# Patient Record
Sex: Male | Born: 1955 | Race: White | Hispanic: No | Marital: Married | Smoking: Never smoker
Health system: Southern US, Community
[De-identification: ages and names within clinical notes are randomized; demographics above are authoritative.]

## PROBLEM LIST (undated history)

## (undated) DIAGNOSIS — E119 Type 2 diabetes mellitus without complications: Secondary | ICD-10-CM

## (undated) DIAGNOSIS — I10 Essential (primary) hypertension: Secondary | ICD-10-CM

## (undated) DIAGNOSIS — M109 Gout, unspecified: Secondary | ICD-10-CM

## (undated) HISTORY — PX: COLONOSCOPY: SHX174

## (undated) HISTORY — PX: NOSE SURGERY: SHX723

## (undated) HISTORY — PX: HERNIA REPAIR: SHX51

## (undated) HISTORY — PX: TONSILLECTOMY: SUR1361

---

## 2010-09-05 ENCOUNTER — Inpatient Hospital Stay (HOSPITAL_COMMUNITY)
Admission: EM | Admit: 2010-09-05 | Discharge: 2010-09-07 | DRG: 570 | Disposition: A | Discharge status: Home / Self Care | Payer: BC Managed Care – PPO | Attending: Internal Medicine | Admitting: Internal Medicine

## 2010-09-05 DIAGNOSIS — Z880 Allergy status to penicillin: Secondary | ICD-10-CM

## 2010-09-05 DIAGNOSIS — N410 Acute prostatitis: Principal | ICD-10-CM | POA: Diagnosis present

## 2010-09-05 DIAGNOSIS — K5730 Diverticulosis of large intestine without perforation or abscess without bleeding: Secondary | ICD-10-CM | POA: Diagnosis present

## 2010-09-05 DIAGNOSIS — I10 Essential (primary) hypertension: Secondary | ICD-10-CM | POA: Diagnosis present

## 2010-09-05 DIAGNOSIS — N179 Acute kidney failure, unspecified: Secondary | ICD-10-CM | POA: Diagnosis present

## 2010-09-05 DIAGNOSIS — R7402 Elevation of levels of lactic acid dehydrogenase (LDH): Secondary | ICD-10-CM | POA: Diagnosis present

## 2010-09-05 DIAGNOSIS — N390 Urinary tract infection, site not specified: Secondary | ICD-10-CM | POA: Diagnosis present

## 2010-09-05 DIAGNOSIS — R7401 Elevation of levels of liver transaminase levels: Secondary | ICD-10-CM | POA: Diagnosis present

## 2010-09-05 DIAGNOSIS — N139 Obstructive and reflux uropathy, unspecified: Secondary | ICD-10-CM | POA: Diagnosis present

## 2010-09-05 DIAGNOSIS — Y929 Unspecified place or not applicable: Secondary | ICD-10-CM

## 2010-09-05 DIAGNOSIS — I959 Hypotension, unspecified: Secondary | ICD-10-CM | POA: Diagnosis present

## 2010-09-05 DIAGNOSIS — A498 Other bacterial infections of unspecified site: Secondary | ICD-10-CM | POA: Diagnosis present

## 2010-09-05 LAB — COMPREHENSIVE METABOLIC PANEL
Albumin: 3.4 g/dL — ABNORMAL LOW (ref 3.5–5.2)
Alkaline Phosphatase: 141 U/L — ABNORMAL HIGH (ref 39–117)
BUN: 19 mg/dL (ref 6–23)
Creatinine, Ser: 1.36 mg/dL — ABNORMAL HIGH (ref 0.50–1.35)
GFR calc Af Amer: >60 mL/min (ref >60)
Glucose, Bld: 128 mg/dL — ABNORMAL HIGH (ref 70–99)
Total Protein: 7.1 g/dL (ref 6.0–8.3)

## 2010-09-05 LAB — CBC
HCT: 39.1 % (ref 39.0–52.0)
MCH: 30.9 pg (ref 26.0–34.0)
MCHC: 34.3 g/dL (ref 30.0–36.0)
MCV: 90.3 fL (ref 78.0–100.0)
Platelets: 148 10*3/uL — ABNORMAL LOW (ref 150–400)
RDW: 13.8 % (ref 11.5–15.5)

## 2010-09-05 LAB — URINALYSIS, ROUTINE W REFLEX MICROSCOPIC
Glucose, UA: NEGATIVE mg/dL
Ketones, ur: NEGATIVE mg/dL
Nitrite: NEGATIVE
Protein, ur: NEGATIVE mg/dL
pH: 6 (ref 5.0–8.0)

## 2010-09-05 LAB — DIFFERENTIAL
Eosinophils Absolute: 0 10*3/uL (ref 0.0–0.7)
Eosinophils Relative: 0 % (ref 0–5)
Neutro Abs: 4.7 10*3/uL (ref 1.7–7.7)
Neutrophils Relative %: 79 % — ABNORMAL HIGH (ref 43–77)

## 2010-09-05 LAB — URINE MICROSCOPIC-ADD ON

## 2010-09-06 ENCOUNTER — Inpatient Hospital Stay (HOSPITAL_COMMUNITY): Payer: BC Managed Care – PPO

## 2010-09-06 LAB — DIFFERENTIAL
Eosinophils Absolute: 0.1 10*3/uL (ref 0.0–0.7)
Eosinophils Relative: 1 % (ref 0–5)
Lymphocytes Relative: 16 % (ref 12–46)
Lymphs Abs: 1 10*3/uL (ref 0.7–4.0)
Monocytes Absolute: 0.8 10*3/uL (ref 0.1–1.0)

## 2010-09-06 LAB — CBC
HCT: 37.8 % — ABNORMAL LOW (ref 39.0–52.0)
MCH: 30.6 pg (ref 26.0–34.0)
MCHC: 33.6 g/dL (ref 30.0–36.0)
MCV: 91.1 fL (ref 78.0–100.0)
Platelets: 140 10*3/uL — ABNORMAL LOW (ref 150–400)
RDW: 14.2 % (ref 11.5–15.5)

## 2010-09-06 LAB — HEPATITIS PANEL, ACUTE
HCV Ab: NEGATIVE
Hep A IgM: NEGATIVE
Hep B C IgM: NEGATIVE
Hepatitis B Surface Ag: NEGATIVE

## 2010-09-06 LAB — COMPREHENSIVE METABOLIC PANEL
AST: 40 U/L — ABNORMAL HIGH (ref 0–37)
Albumin: 2.8 g/dL — ABNORMAL LOW (ref 3.5–5.2)
BUN: 17 mg/dL (ref 6–23)
Calcium: 9.1 mg/dL (ref 8.4–10.5)
Creatinine, Ser: 1.18 mg/dL (ref 0.50–1.35)
Total Bilirubin: 0.2 mg/dL — ABNORMAL LOW (ref 0.3–1.2)
Total Protein: 6.9 g/dL (ref 6.0–8.3)

## 2010-09-06 LAB — MAGNESIUM: Magnesium: 2.4 mg/dL (ref 1.5–2.5)

## 2010-09-06 MED ORDER — IOHEXOL 300 MG/ML  SOLN: 100.0000 mL | Freq: Once | INTRAMUSCULAR | Status: AC | PRN

## 2010-09-07 LAB — CBC
HCT: 37.2 % — ABNORMAL LOW (ref 39.0–52.0)
MCHC: 33.1 g/dL (ref 30.0–36.0)
Platelets: 165 10*3/uL (ref 150–400)
RDW: 14.1 % (ref 11.5–15.5)

## 2010-09-07 LAB — COMPREHENSIVE METABOLIC PANEL
AST: 33 U/L (ref 0–37)
Albumin: 3.1 g/dL — ABNORMAL LOW (ref 3.5–5.2)
Alkaline Phosphatase: 111 U/L (ref 39–117)
BUN: 14 mg/dL (ref 6–23)
Potassium: 4.2 mEq/L (ref 3.5–5.1)
Total Protein: 6.6 g/dL (ref 6.0–8.3)

## 2010-09-07 LAB — URINE CULTURE
Colony Count: NO GROWTH
Culture  Setup Time: 201206220112
Culture: NO GROWTH

## 2010-09-09 NOTE — H&P (Signed)
NAME:  Jeffrey [name], SUIT NO.:  [id number]  [name]  LOCATION:                                 FACILITY:  PHYSICIAN:  Talmage Nap, MD  DATE OF BIRTH:  [birth year]  DATE OF ADMISSION:  09/06/2010 DATE OF DISCHARGE:                             HISTORY & PHYSICAL   PRIMARY CARE PHYSICIAN:  Unassigned.  History obtainable from the patient and the patient's spouse.  CHIEF COMPLAINT:  Persistent fever with slight urinary hesitancy of about 2 days' duration.  HISTORY OF PRESENT ILLNESS:  The patient is a 55 year old Caucasian male with no known medical history presenting to the emergency room with recurrent fever.  The patient claimed that 2 days prior to presenting to the emergency room, he developed a fever and this was said to be associated with slight urinary hesitancy.  He denied any history of nausea.  He denied any history of vomiting.  He denied any chills or rigor.  He saw his urologist, who gave the patient IM gentamicin and some analgesics to use; however, fever was said to have persisted and the patient was still having urinary hesitancy.  He denied any chest pain. He denied any shortness of breath, but this around he claimed he had chills and rigor, and subsequently presented to the emergency room to be evaluated.  He has no known past medical history.  PAST SURGICAL HISTORY:  Left herniorrhaphy since the patient was a kid.  PREADMISSION MEDICATIONS:  Include Bactrim and Flomax.  ALLERGIES:  PENICILLIN.  SOCIAL HISTORY:  Negative for alcohol or tobacco use.  The patient works with live [employer].  FAMILY HISTORY:  York Spaniel to be positive for diabetes mellitus.  REVIEW OF SYSTEMS:  The patient denies any history of headaches.  No blurred vision.  No nausea or vomiting.  Fever has subsided.  He denied any chest pain.  Denies any cough.  Denies any PND or orthopnea.  No abdominal discomfort.  He complained of slight urinary  hesitancy. Denies any dysuria or hematuria.  No diarrhea or hematochezia.  No swelling of the lower extremities.  No intolerance to heat or cold and no neuropsychiatric disorder.  PHYSICAL EXAMINATION:  GENERAL:  Middle-aged man with suboptimal hydration, not in any obvious respiratory distress. PRESENT VITAL SIGNS:  Blood pressure is 112/77, temperature is 98.4, pulse is 79, respiratory rate 18. HEENT:  Pupils are reactive to light and extraocular muscles are intact. NECK:  No jugular venous distention.  No carotid bruit.  No lymphadenopathy. CHEST:  Clear to auscultation. HEART:  S1 and S2. ABDOMEN:  Soft, nontender.  Liver, spleen tip not palpable.  Bowel sounds are positive. EXTREMITIES:  No pedal edema. NEUROLOGIC:  Nonfocal. MUSCULOSKELETAL SYSTEM:  Unremarkable. SKIN:  Shows slightly decreased turgor.  LABORATORY DATA:  Initial complete blood count with differential showed WBC of 6.0, hemoglobin of 13.4, hematocrit 39.1, MCV of 90.2 with a platelet count of 148, neutrophils 79%, and absolute neutrophil count is 4.7.  Lactic acid level is 1.3.  Comprehensive metabolic panel showed sodium of 140, potassium of 3.8, chloride of 102 with a bicarb of 24, glucose is 128, BUN is 19, creatinine is 1.36.  LFTs showed total bilirubin 0.2, alkaline phosphatase 141, AST 55, ALT 57.  Urinalysis negative nitrite with moderate leukocyte esterase.  Urine microscopy; wbc 25-50, rare bacteria.  IMPRESSION: 1. Recurrent fever, questionable secondary to acute prostatitis. 2. Questionable bladder outlet obstruction. 3. Abnormal LFT to rule out hepatitis.  PLAN:  Admit the patient to general medical floor.  The patient will be adequately rehydrated with half-normal saline IV to go at a rate of 100 cc an hour.  He will be on Cipro 400 mg IV q.12, Flomax 0.4 mg p.o. b.i.d.  Fever will be controlled with ibuprofen 400 mg p.o. t.i.d. p.r.n.  He will be on Protonix 40 mg p.o. daily for GI  prophylaxis and TED stocking or SCDs boots for DVT prophylaxis.  Further workup to be done on this patient will include urine culture, blood culture x2, hepatitis panel to include A, B, and C because of the abnormal LFT. CBC, CMP, and magnesium will be repeated in a.m.  The patient will be followed and evaluated on a day-to-day basis.     Talmage Nap, MD     CN/MEDQ  D:  09/06/2010  T:  09/06/2010  Job:  [id number]  Electronically Signed by Talmage Nap  on 09/09/2010 06:38:12 PM

## 2010-09-10 NOTE — Discharge Summary (Signed)
  NAME:  Jeffrey [name], Jeffrey [name] NO.:  [id number]  [name]  LOCATION:  1538                         FACILITY:  [facility]  PHYSICIAN:  Conley Canal, MD      DATE OF BIRTH:  1956-02-12  DATE OF ADMISSION:  09/05/2010 DATE OF DISCHARGE:  09/07/2010                        DISCHARGE SUMMARY - REFERRING   UROLOGIST:  Mark C. Vernie Ammons, M.D.  DISCHARGE DIAGNOSES: 1. Prostatitis, possibility of prostate cancer. 2. Status post left herniorrhaphy in childhood. 3. Transient transaminitis. 4. Acute kidney injury, probably combination of medications and     hypotension, since resolved. 5. Diverticulosis.  PROCEDURES PERFORMED:  CT abdomen and pelvis on June 22 showed a 3-cm asymmetric soft tissue density involving the prostate in the right posterior peripheral zone, which may be G2 prostate carcinoma or prostatitis.  It also showed some diverticulosis with no radiographic evidence of diverticulitis.  HOSPITAL COURSE:  This extremely pleasant 55 year old male came in with complaints of fever and urinary hesitancy.  The patient had been getting antibiotics on and off in the office for E. coli urinary tract infection with suspicion for prostatitis.  However, he kept having fever despite antibiotics, hence ending up in the emergency room.  His urinalysis confirmed UTI with nitrites negative, leukocytes positive, WBC 21 to 50. A urine culture was pending at the time of discharge, but we were able to obtain records from Dr. Margrett Rud office which showed that E. coli was pansensitive.  Upon admission, the patient was placed on ceftriaxone and Flagyl, then switched to ciprofloxacin.  His white count was 6.  It has remained at 6 on the day of discharge.  Renal function showed a BUN of 19, creatinine 1.36 and he had transaminitis with alkaline phosphatase of 141, AST 55, ALT 57.  His blood pressure was low normal. According to the patient and his wife, he was taking a  lot of Motrin and Tylenol around the clock for the pain and fever.  This may have contributed to the transaminitis and there may have been an element of hypotension.  Hepatitis panel was negative and the transaminitis had resolved on the day of discharge.  Renal function was back to normal also with a BUN of 14, creatinine 1.22.  I discussed with Dr. Brunilda Payor regarding the abnormal CT abdomen and pelvis and his thought was that if patient does not have fever he can be followed in the office by Dr. Vernie Ammons to consider further management and that the patient should be on ciprofloxacin.  Looking at the sensitivities, the patient seems to be more sensitive to Levaquin which he will be discharged on for at least 10 days.  He is hemodynamically stable and is discharged in stable condition.  Time spent for discharge preparation is less than 30 minutes.     Conley Canal, MD     SR/MEDQ  D:  09/07/2010  T:  09/07/2010  Job:  [id number]  cc:   Veverly Fells. Vernie Ammons, M.D. Fax: [phone]  Lindaann Slough, M.D. Fax: [phone]  Electronically Signed by Conley Canal  on 09/10/2010 10:21:34 AM

## 2010-09-12 LAB — CULTURE, BLOOD (ROUTINE X 2)
Culture  Setup Time: 201206220826
Culture: NO GROWTH

## 2013-11-30 ENCOUNTER — Ambulatory Visit
Admission: RE | Admit: 2013-11-30 | Discharge: 2013-11-30 | Disposition: A | Discharge status: Home / Self Care | Payer: BC Managed Care – PPO | Source: Ambulatory Visit | Attending: Family Medicine | Admitting: Family Medicine

## 2013-11-30 ENCOUNTER — Other Ambulatory Visit: Payer: Self-pay | Admitting: Family Medicine

## 2013-11-30 DIAGNOSIS — M79672 Pain in left foot: Secondary | ICD-10-CM

## 2017-04-03 ENCOUNTER — Other Ambulatory Visit: Payer: Self-pay | Admitting: Urology

## 2017-04-03 DIAGNOSIS — R972 Elevated prostate specific antigen [PSA]: Secondary | ICD-10-CM

## 2017-04-20 ENCOUNTER — Ambulatory Visit
Admission: RE | Admit: 2017-04-20 | Discharge: 2017-04-20 | Disposition: A | Discharge status: Home / Self Care | Payer: BLUE CROSS/BLUE SHIELD | Source: Ambulatory Visit | Attending: Urology | Admitting: Urology

## 2017-04-20 DIAGNOSIS — R972 Elevated prostate specific antigen [PSA]: Secondary | ICD-10-CM

## 2017-04-20 MED ORDER — GADOBENATE DIMEGLUMINE 529 MG/ML IV SOLN
19.0000 mL | Freq: Once | INTRAVENOUS | Status: AC | PRN
  Administered 2017-04-20: 19 mL via INTRAVENOUS

## 2019-09-13 IMAGING — MR MR PROSTATE WO/W CM
56 series · 56 of 56 positions shown · IV contrast (MULTIHANCE)
Comparison: CT pelvis 09/06/2010

CLINICAL DATA: Elevated PSA levels most recently 9.6.

EXAM:
MR PROSTATE WITHOUT AND WITH CONTRAST
TECHNIQUE: Multiplanar multisequence MRI images were obtained of the pelvis
centered about the prostate. Pre and post contrast images were
obtained.
CONTRAST:  19mL MULTIHANCE GADOBENATE DIMEGLUMINE 529 MG/ML IV SOLN
Creatinine was obtained on site at [HOSPITAL] at [HOSPITAL].
Results: Creatinine 1.2 mg/dL.

[Series 3: T1 · axial · 8.0mm · 1.06mm/px · 1 of 28 slices shown (1 of 2)]
[im 1/28]
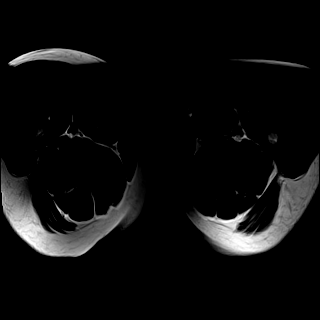

[Series 4: bSSFP fat-sat · axial · 8.0mm · 0.74mm/px · 1 of 28 slices shown]
[im 1/28]
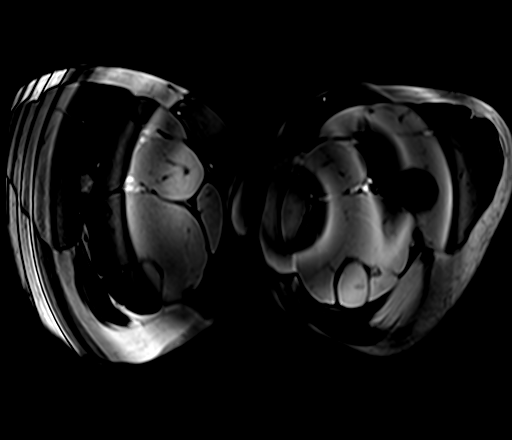

[Series 5: T2 · sagittal · 3.5mm · 0.56mm/px · 1 of 39 slices shown (1 of 4)]
[im 1/39]
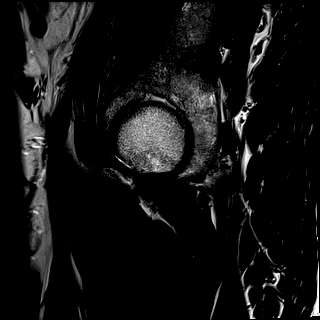

[Series 6: T1 · axial · 3.0mm · 0.31mm/px · 1 of 29 slices shown (2 of 2)]
[im 1/29]
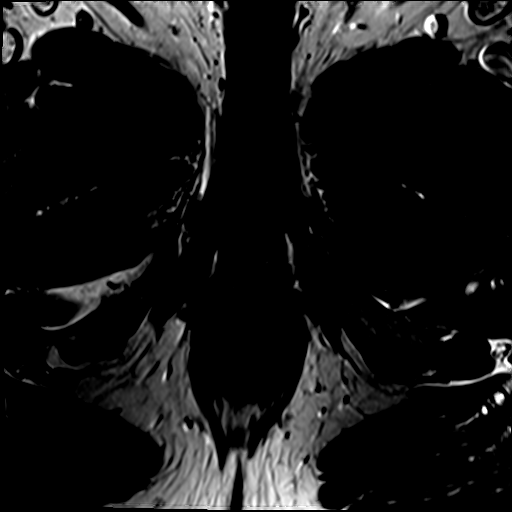

[Series 7: T2 · axial · 3.5mm · 0.56mm/px · 1 of 27 slices shown (2 of 4)]
[im 1/27]
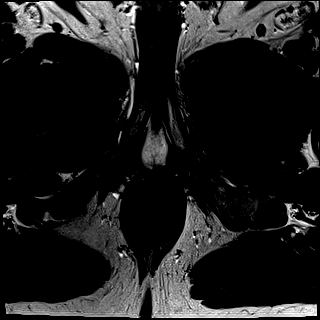

[Series 8: T2 · axial · 1.0mm · 1.04mm/px · 1 of 88 slices shown (3 of 4)]
[im 1/88]
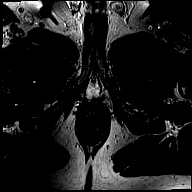

[Series 9: T2 · coronal · 3.5mm · 0.56mm/px · 1 of 23 slices shown (4 of 4)]
[im 1/23]
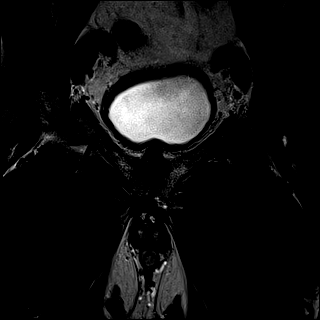

[Series 10: DWI · axial · 3.5mm · 1.56mm/px · 1 of 81 slices shown (1 of 2)]
[im 1/81]
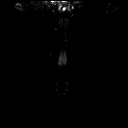

[Series 11: DWI · axial · 3.5mm · 1.56mm/px · 1 of 27 slices shown (2 of 2)]
[im 1/27]
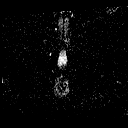

[Series 12: pre t1_twist_tra_dyn_ttc=6.4s · axial · non-contrast · 3.5mm · 0.83mm/px · 1 of 24 slices shown]
[im 1/24]
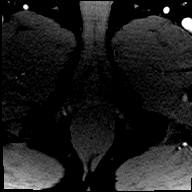

[Series 13: post t1_twist_tra_dyn-copy center · axial · 3.5mm · 0.83mm/px · 1 of 24 slices shown (1 of 24)]
[im 1/24]
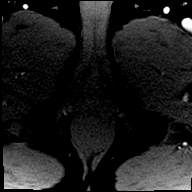

[Series 14: post t1_twist_tra_dyn-copy center · axial · 3.5mm · 0.83mm/px · 1 of 24 slices shown (2 of 24)]
[im 1/24]
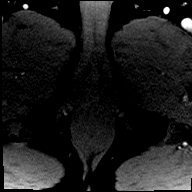

[Series 15: post t1_twist_tra_dyn-copy cent_sub_ttc=(id) · axial · 3.5mm · 0.83mm/px · 1 of 24 slices shown (1 of 22)]
[im 1/24]
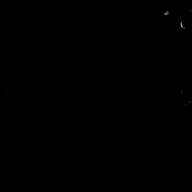

[Series 16: post t1_twist_tra_dyn-copy center · axial · 3.5mm · 0.83mm/px · 1 of 24 slices shown (3 of 24)]
[im 1/24]
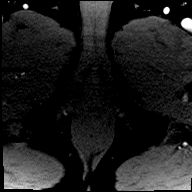

[Series 17: post t1_twist_tra_dyn-copy cent_sub_ttc=(id) · axial · 3.5mm · 0.83mm/px · 1 of 24 slices shown (2 of 22)]
[im 1/24]
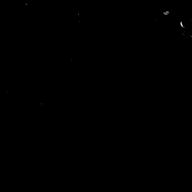

[Series 18: post t1_twist_tra_dyn-copy center · axial · 3.5mm · 0.83mm/px · 1 of 24 slices shown (4 of 24)]
[im 1/24]
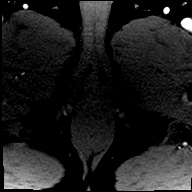

[Series 19: post t1_twist_tra_dyn-copy cent_sub_ttc=(id) · axial · 3.5mm · 0.83mm/px · 1 of 24 slices shown (3 of 22)]
[im 1/24]
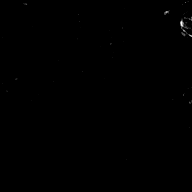

[Series 20: post t1_twist_tra_dyn-copy center · axial · 3.5mm · 0.83mm/px · 1 of 24 slices shown (5 of 24)]
[im 1/24]
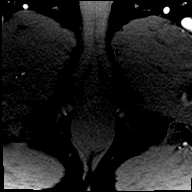

[Series 21: post t1_twist_tra_dyn-copy cent_sub_ttc=(id) · axial · 3.5mm · 0.83mm/px · 1 of 24 slices shown (4 of 22)]
[im 1/24]
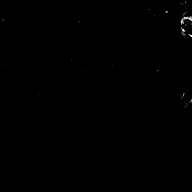

[Series 22: post t1_twist_tra_dyn-copy center · axial · 3.5mm · 0.83mm/px · 1 of 24 slices shown (6 of 24)]
[im 1/24]
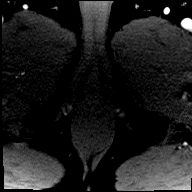

[Series 23: post t1_twist_tra_dyn-copy cent_sub_ttc=(id) · axial · 3.5mm · 0.83mm/px · 1 of 24 slices shown (5 of 22)]
[im 1/24]
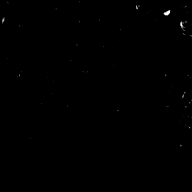

[Series 24: post t1_twist_tra_dyn-copy center · axial · 3.5mm · 0.83mm/px · 1 of 24 slices shown (7 of 24)]
[im 1/24]
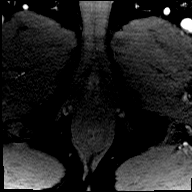

[Series 25: post t1_twist_tra_dyn-copy cent_sub_ttc=(id) · axial · 3.5mm · 0.83mm/px · 1 of 24 slices shown (6 of 22)]
[im 1/24]
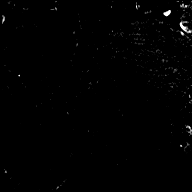

[Series 26: post t1_twist_tra_dyn-copy center · axial · 3.5mm · 0.83mm/px · 1 of 24 slices shown (8 of 24)]
[im 1/24]
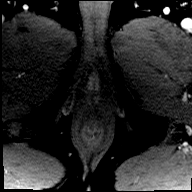

[Series 27: post t1_twist_tra_dyn-copy cent_sub_ttc=(id) · axial · 3.5mm · 0.83mm/px · 1 of 24 slices shown (7 of 22)]
[im 1/24]
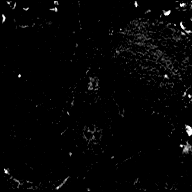

[Series 28: post t1_twist_tra_dyn-copy center · axial · 3.5mm · 0.83mm/px · 1 of 24 slices shown (9 of 24)]
[im 1/24]
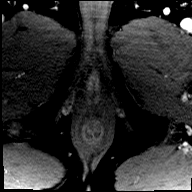

[Series 29: post t1_twist_tra_dyn-copy cent_sub_ttc=(id) · axial · 3.5mm · 0.83mm/px · 1 of 24 slices shown (8 of 22)]
[im 1/24]
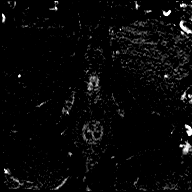

[Series 30: post t1_twist_tra_dyn-copy center · axial · 3.5mm · 0.83mm/px · 1 of 24 slices shown (10 of 24)]
[im 1/24]
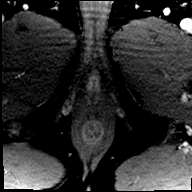

[Series 31: post t1_twist_tra_dyn-copy cent_sub_ttc=(id) · axial · 3.5mm · 0.83mm/px · 1 of 24 slices shown (9 of 22)]
[im 1/24]
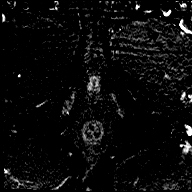

[Series 32: post t1_twist_tra_dyn-copy center · axial · 3.5mm · 0.83mm/px · 1 of 24 slices shown (11 of 24)]
[im 1/24]
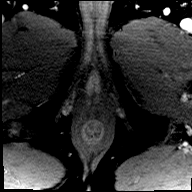

[Series 33: post t1_twist_tra_dyn-copy cent_sub_ttc=(id) · axial · 3.5mm · 0.83mm/px · 1 of 24 slices shown (10 of 22)]
[im 1/24]
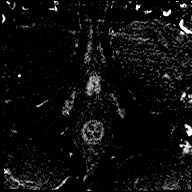

[Series 34: post t1_twist_tra_dyn-copy center · axial · 3.5mm · 0.83mm/px · 1 of 24 slices shown (12 of 24)]
[im 1/24]
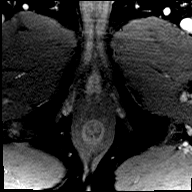

[Series 35: post t1_twist_tra_dyn-copy cent_sub_ttc=(id) · axial · 3.5mm · 0.83mm/px · 1 of 24 slices shown (11 of 22)]
[im 1/24]
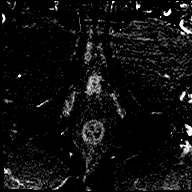

[Series 36: post t1_twist_tra_dyn-copy center · axial · 3.5mm · 0.83mm/px · 1 of 24 slices shown (13 of 24)]
[im 1/24]
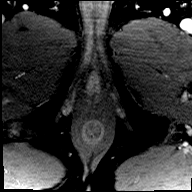

[Series 37: post t1_twist_tra_dyn-copy cent_sub_ttc=(id) · axial · 3.5mm · 0.83mm/px · 1 of 24 slices shown (12 of 22)]
[im 1/24]
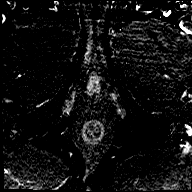

[Series 38: post t1_twist_tra_dyn-copy center · axial · 3.5mm · 0.83mm/px · 1 of 24 slices shown (14 of 24)]
[im 1/24]
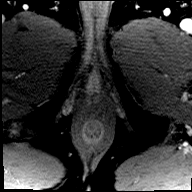

[Series 39: post t1_twist_tra_dyn-copy cent_sub_ttc=(id) · axial · 3.5mm · 0.83mm/px · 1 of 24 slices shown (13 of 22)]
[im 1/24]
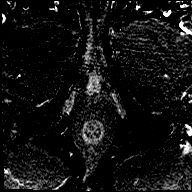

[Series 40: post t1_twist_tra_dyn-copy center · axial · 3.5mm · 0.83mm/px · 1 of 24 slices shown (15 of 24)]
[im 1/24]
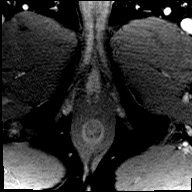

[Series 41: post t1_twist_tra_dyn-copy cent_sub_ttc=(id) · axial · 3.5mm · 0.83mm/px · 1 of 24 slices shown (14 of 22)]
[im 1/24]
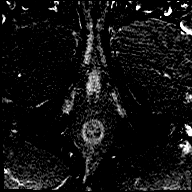

[Series 42: post t1_twist_tra_dyn-copy center · axial · 3.5mm · 0.83mm/px · 1 of 24 slices shown (16 of 24)]
[im 1/24]
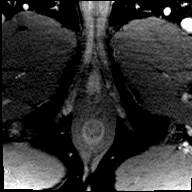

[Series 43: post t1_twist_tra_dyn-copy cent_sub_ttc=(id) · axial · 3.5mm · 0.83mm/px · 1 of 24 slices shown (15 of 22)]
[im 1/24]
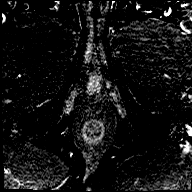

[Series 44: post t1_twist_tra_dyn-copy center · axial · 3.5mm · 0.83mm/px · 1 of 24 slices shown (17 of 24)]
[im 1/24]
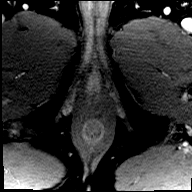

[Series 45: post t1_twist_tra_dyn-copy cent_sub_ttc=(id) · axial · 3.5mm · 0.83mm/px · 1 of 24 slices shown (16 of 22)]
[im 1/24]
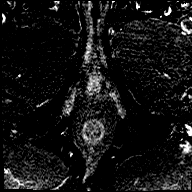

[Series 46: post t1_twist_tra_dyn-copy center · axial · 3.5mm · 0.83mm/px · 1 of 24 slices shown (18 of 24)]
[im 1/24]
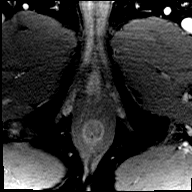

[Series 47: post t1_twist_tra_dyn-copy cent_sub_ttc=(id) · axial · 3.5mm · 0.83mm/px · 1 of 24 slices shown (17 of 22)]
[im 1/24]
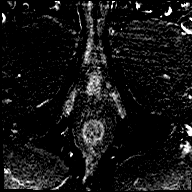

[Series 48: post t1_twist_tra_dyn-copy center · axial · 3.5mm · 0.83mm/px · 1 of 24 slices shown (19 of 24)]
[im 1/24]
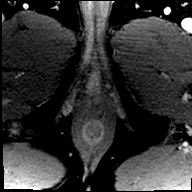

[Series 49: post t1_twist_tra_dyn-copy cent_sub_ttc=(id) · axial · 3.5mm · 0.83mm/px · 1 of 24 slices shown (18 of 22)]
[im 1/24]
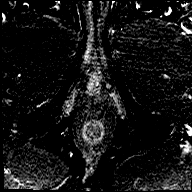

[Series 50: post t1_twist_tra_dyn-copy center · axial · 3.5mm · 0.83mm/px · 1 of 24 slices shown (20 of 24)]
[im 1/24]
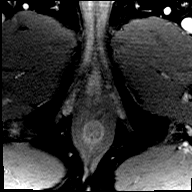

[Series 51: post t1_twist_tra_dyn-copy cent_sub_ttc=(id) · axial · 3.5mm · 0.83mm/px · 1 of 24 slices shown (19 of 22)]
[im 1/24]
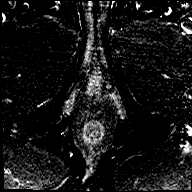

[Series 52: post t1_twist_tra_dyn-copy center · axial · 3.5mm · 0.83mm/px · 1 of 24 slices shown (21 of 24)]
[im 1/24]
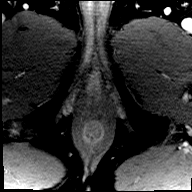

[Series 53: post t1_twist_tra_dyn-copy cent_sub_ttc=(id) · axial · 3.5mm · 0.83mm/px · 1 of 24 slices shown (20 of 22)]
[im 1/24]
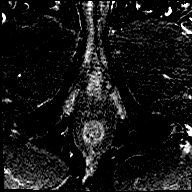

[Series 54: post t1_twist_tra_dyn-copy center · axial · 3.5mm · 0.83mm/px · 1 of 24 slices shown (22 of 24)]
[im 1/24]
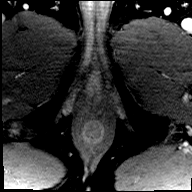

[Series 55: post t1_twist_tra_dyn-copy cent_sub_ttc=(id) · axial · 3.5mm · 0.83mm/px · 1 of 24 slices shown (21 of 22)]
[im 1/24]
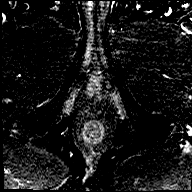

[Series 56: post t1_twist_tra_dyn-copy center · axial · 3.5mm · 0.83mm/px · 1 of 24 slices shown (23 of 24)]
[im 1/24]
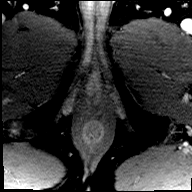

[Series 57: post t1_twist_tra_dyn-copy cent_sub_ttc=(id) · axial · 3.5mm · 0.83mm/px · 1 of 24 slices shown (22 of 22)]
[im 1/24]
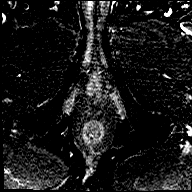

[Series 58: post t1_twist_tra_dyn-copy center · axial · 3.5mm · 0.83mm/px · 1 of 24 slices shown (24 of 24)]
[im 1/24]
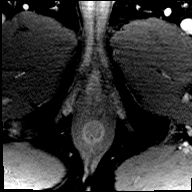

[56 of 56 positions shown; findings below may reference images not displayed]

FINDINGS: Prostate: PI-RADS category 4 lesion in the right posterior
peripheral zone of the prostate base and right posterolateral
peripheral zone of the right mid gland demonstrates somewhat wedge
or bandlike low T2 and ADC map activity but also has early
enhancement. This measures 0.72 cc (2.0 by 0.5 by 1.3 cm). Region of
interest # 1.

PI-RADS category 2 lesion of the left apical posterolateral and
posteromedial peripheral zone with indistinct ADC map activity. This
measures 0.32 cubic cm (1.3 by 0.9 by 0.6 cm). Region of interest #
2.

Volume: 3D volumetric analysis: 64.95 cc (5.1 by 5.0 by 5.2 cm).

Transcapsular spread:  Absent

Seminal vesicle involvement: Absent

Neurovascular bundle involvement: Absent

Pelvic adenopathy: Absent

Bone metastasis: Absent

Other findings: Sigmoid diverticulosis.
IMPRESSION: 1. PI-RADS category 4 lesion in the right posterior peripheral zone
of the prostate base and right posterolateral peripheral zone of the
right mid gland.
2. PI-RADS category 2 lesion of the left posteromedial and
posterolateral apex peripheral zone.
3. Prostatomegaly, prostate approximately 65 cubic cm in volume.
4. Sigmoid diverticulosis.

## 2020-03-19 DIAGNOSIS — L820 Inflamed seborrheic keratosis: Secondary | ICD-10-CM | POA: Diagnosis not present

## 2020-03-19 DIAGNOSIS — L989 Disorder of the skin and subcutaneous tissue, unspecified: Secondary | ICD-10-CM | POA: Diagnosis not present

## 2020-03-19 DIAGNOSIS — D1801 Hemangioma of skin and subcutaneous tissue: Secondary | ICD-10-CM | POA: Diagnosis not present

## 2020-03-19 DIAGNOSIS — C44319 Basal cell carcinoma of skin of other parts of face: Secondary | ICD-10-CM | POA: Diagnosis not present

## 2020-03-19 DIAGNOSIS — D485 Neoplasm of uncertain behavior of skin: Secondary | ICD-10-CM | POA: Diagnosis not present

## 2020-03-19 DIAGNOSIS — D225 Melanocytic nevi of trunk: Secondary | ICD-10-CM | POA: Diagnosis not present

## 2020-03-19 DIAGNOSIS — C44612 Basal cell carcinoma of skin of right upper limb, including shoulder: Secondary | ICD-10-CM | POA: Diagnosis not present

## 2020-04-19 DIAGNOSIS — C44311 Basal cell carcinoma of skin of nose: Secondary | ICD-10-CM | POA: Diagnosis not present

## 2020-04-27 DIAGNOSIS — J301 Allergic rhinitis due to pollen: Secondary | ICD-10-CM | POA: Diagnosis not present

## 2020-04-27 DIAGNOSIS — I10 Essential (primary) hypertension: Secondary | ICD-10-CM | POA: Diagnosis not present

## 2020-04-27 DIAGNOSIS — E119 Type 2 diabetes mellitus without complications: Secondary | ICD-10-CM | POA: Diagnosis not present

## 2020-04-27 DIAGNOSIS — E7800 Pure hypercholesterolemia, unspecified: Secondary | ICD-10-CM | POA: Diagnosis not present

## 2020-04-27 DIAGNOSIS — N1831 Chronic kidney disease, stage 3a: Secondary | ICD-10-CM | POA: Diagnosis not present

## 2020-04-27 DIAGNOSIS — M1A0790 Idiopathic chronic gout, unspecified ankle and foot, without tophus (tophi): Secondary | ICD-10-CM | POA: Diagnosis not present

## 2020-05-09 ENCOUNTER — Ambulatory Visit: Payer: Medicare Other | Admitting: Plastic Surgery

## 2020-05-09 ENCOUNTER — Other Ambulatory Visit: Payer: Self-pay

## 2020-05-09 ENCOUNTER — Encounter: Payer: Self-pay | Admitting: Plastic Surgery

## 2020-05-09 VITALS — BP 170/110 | HR 102 | Temp 97.3°F

## 2020-05-09 DIAGNOSIS — M950 Acquired deformity of nose: Secondary | ICD-10-CM | POA: Diagnosis not present

## 2020-05-09 DIAGNOSIS — E119 Type 2 diabetes mellitus without complications: Secondary | ICD-10-CM | POA: Diagnosis not present

## 2020-05-09 DIAGNOSIS — N1831 Chronic kidney disease, stage 3a: Secondary | ICD-10-CM | POA: Diagnosis not present

## 2020-05-09 NOTE — Progress Notes (Signed)
Referring Provider Shirline Frees, MD [address],  [place] [address]   CC:  Chief Complaint  Patient presents with  . Advice Only      Jeffrey [name] is an 65 y.o. male.  HPI: Patient presents to discuss reconstruction of his left nasal ala.  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  It sounds like this was a near full-thickness excision of the ala and Dr. Norina Buzzard cough performed a skin graft.  It sounds like there was some trauma while the graft was in place and it ended up not taking and a notch has form in the ala.  He is sent to me for advice on reconstruction of this.  Not on File  Outpatient Encounter Medications as of 05/09/2020  Medication Sig  . allopurinol (ZYLOPRIM) 100 MG tablet Take 100 mg by mouth daily.  Marland Kitchen atorvastatin (LIPITOR) 40 MG tablet Take 40 mg by mouth daily.  Marland Kitchen lisinopril (ZESTRIL) 20 MG tablet Take 20 mg by mouth daily.  . metFORMIN (GLUCOPHAGE) 500 MG tablet Take 500 mg by mouth 2 (two) times daily with a meal.  . saw palmetto 160 MG capsule Take 160 mg by mouth 2 (two) times daily.  . tamsulosin (FLOMAX) 0.4 MG CAPS capsule Take 0.4 mg by mouth.   No facility-administered encounter medications on file as of 05/09/2020.     No past medical history on file.  No family history on file.  Social History   Social History Narrative  . Not on file  Denies tobacco use  Review of Systems General: Denies fevers, chills, weight loss CV: Denies chest pain, shortness of breath, palpitations  Physical Exam Vitals with BMI 8/93/8101  Systolic 751  Diastolic 025  Pulse 852    General:  No acute distress,  Alert and oriented, Non-Toxic, Normal speech and affect On examination he has an option his left nasal ala about midway.  There is some surrounding fibrinous and granulation tissue.  I suspect if it were to be properly debrided the defect would be about 1.5 cm in width.  The other aspects of the ala show good support.   There is no scars on his cheek.  The previous skin graft was taken from the conchal bowl of his left ear and this area looks to be epithelializing.  Assessment/Plan Patient presents with a full-thickness left alar defect after failed skin graft.  I explained that in my opinion the area once debrided would be too big to close part primarily.  The options that we discussed are a composite graft from the ear and the nasolabial flap plus or minus a cartilage graft depending on the support after debridement.  I explained there is probably a 50-50 chance of the composite graft being successful given the anticipated size of the defect.  I feel that the nasolabial flap would be a bit more reliable but would require a second stage division and inset.  Patient wants either procedure to be done in the operating room.  We discussed the risks and benefits of both procedures that include bleeding, infection, damage to surrounding structures and need for additional procedures.  We discussed potential for graft or flap failure and the potential for persistent contour irregularities.  The patient and his wife agreed to think about their options and give [place] a call back.  They want to move forward with some form of reconstruction next week so we will start looking for a time on  the schedule.  All their questions were answered.  Cindra Presume 05/09/2020, 4:53 PM

## 2020-05-10 ENCOUNTER — Institutional Professional Consult (permissible substitution): Payer: BLUE CROSS/BLUE SHIELD | Admitting: Plastic Surgery

## 2020-05-14 ENCOUNTER — Telehealth: Payer: Self-pay | Admitting: Plastic Surgery

## 2020-05-14 NOTE — Telephone Encounter (Signed)
Message sent from [place] at the front desk, relaying message from Mr. Trim' wife (Amy) stating that the patient has decided to leave his nose like it is and he is happy with it. She said he doesn't think his nerves can take anything else and he will not be having surgery next week.

## 2020-05-31 DIAGNOSIS — L905 Scar conditions and fibrosis of skin: Secondary | ICD-10-CM | POA: Diagnosis not present

## 2020-05-31 DIAGNOSIS — C44612 Basal cell carcinoma of skin of right upper limb, including shoulder: Secondary | ICD-10-CM | POA: Diagnosis not present

## 2020-10-25 DIAGNOSIS — E119 Type 2 diabetes mellitus without complications: Secondary | ICD-10-CM | POA: Diagnosis not present

## 2020-10-25 DIAGNOSIS — E7800 Pure hypercholesterolemia, unspecified: Secondary | ICD-10-CM | POA: Diagnosis not present

## 2020-10-25 DIAGNOSIS — Z0000 Encounter for general adult medical examination without abnormal findings: Secondary | ICD-10-CM | POA: Diagnosis not present

## 2020-10-25 DIAGNOSIS — M1A0620 Idiopathic chronic gout, left knee, without tophus (tophi): Secondary | ICD-10-CM | POA: Diagnosis not present

## 2020-10-25 DIAGNOSIS — Z23 Encounter for immunization: Secondary | ICD-10-CM | POA: Diagnosis not present

## 2020-10-25 DIAGNOSIS — I10 Essential (primary) hypertension: Secondary | ICD-10-CM | POA: Diagnosis not present

## 2021-04-03 DIAGNOSIS — Z87442 Personal history of urinary calculi: Secondary | ICD-10-CM | POA: Diagnosis not present

## 2021-05-02 DIAGNOSIS — J301 Allergic rhinitis due to pollen: Secondary | ICD-10-CM | POA: Diagnosis not present

## 2021-05-02 DIAGNOSIS — E119 Type 2 diabetes mellitus without complications: Secondary | ICD-10-CM | POA: Diagnosis not present

## 2021-05-02 DIAGNOSIS — I10 Essential (primary) hypertension: Secondary | ICD-10-CM | POA: Diagnosis not present

## 2021-05-02 DIAGNOSIS — E7800 Pure hypercholesterolemia, unspecified: Secondary | ICD-10-CM | POA: Diagnosis not present

## 2021-05-02 DIAGNOSIS — N1831 Chronic kidney disease, stage 3a: Secondary | ICD-10-CM | POA: Diagnosis not present

## 2021-10-24 DIAGNOSIS — H2513 Age-related nuclear cataract, bilateral: Secondary | ICD-10-CM | POA: Diagnosis not present

## 2021-10-24 DIAGNOSIS — E119 Type 2 diabetes mellitus without complications: Secondary | ICD-10-CM | POA: Diagnosis not present

## 2021-11-05 DIAGNOSIS — M109 Gout, unspecified: Secondary | ICD-10-CM | POA: Diagnosis not present

## 2021-11-05 DIAGNOSIS — E7800 Pure hypercholesterolemia, unspecified: Secondary | ICD-10-CM | POA: Diagnosis not present

## 2021-11-05 DIAGNOSIS — E119 Type 2 diabetes mellitus without complications: Secondary | ICD-10-CM | POA: Diagnosis not present

## 2021-11-05 DIAGNOSIS — S161XXA Strain of muscle, fascia and tendon at neck level, initial encounter: Secondary | ICD-10-CM | POA: Diagnosis not present

## 2021-11-05 DIAGNOSIS — J301 Allergic rhinitis due to pollen: Secondary | ICD-10-CM | POA: Diagnosis not present

## 2021-11-05 DIAGNOSIS — I10 Essential (primary) hypertension: Secondary | ICD-10-CM | POA: Diagnosis not present

## 2021-11-05 DIAGNOSIS — Z0000 Encounter for general adult medical examination without abnormal findings: Secondary | ICD-10-CM | POA: Diagnosis not present

## 2022-01-30 DIAGNOSIS — E119 Type 2 diabetes mellitus without complications: Secondary | ICD-10-CM | POA: Diagnosis not present

## 2022-04-04 DIAGNOSIS — R3121 Asymptomatic microscopic hematuria: Secondary | ICD-10-CM | POA: Diagnosis not present

## 2022-04-24 DIAGNOSIS — R3121 Asymptomatic microscopic hematuria: Secondary | ICD-10-CM | POA: Diagnosis not present

## 2022-04-24 DIAGNOSIS — K429 Umbilical hernia without obstruction or gangrene: Secondary | ICD-10-CM | POA: Diagnosis not present

## 2022-04-24 DIAGNOSIS — K5730 Diverticulosis of large intestine without perforation or abscess without bleeding: Secondary | ICD-10-CM | POA: Diagnosis not present

## 2022-05-20 DIAGNOSIS — M109 Gout, unspecified: Secondary | ICD-10-CM | POA: Diagnosis not present

## 2022-05-20 DIAGNOSIS — E7800 Pure hypercholesterolemia, unspecified: Secondary | ICD-10-CM | POA: Diagnosis not present

## 2022-05-20 DIAGNOSIS — E1165 Type 2 diabetes mellitus with hyperglycemia: Secondary | ICD-10-CM | POA: Diagnosis not present

## 2022-05-20 DIAGNOSIS — I10 Essential (primary) hypertension: Secondary | ICD-10-CM | POA: Diagnosis not present

## 2022-05-26 ENCOUNTER — Other Ambulatory Visit: Payer: Self-pay | Admitting: Urology

## 2022-05-26 DIAGNOSIS — R3121 Asymptomatic microscopic hematuria: Secondary | ICD-10-CM | POA: Diagnosis not present

## 2022-06-04 NOTE — Patient Instructions (Signed)
SURGICAL WAITING ROOM VISITATION  Patients having surgery or a procedure may have no more than 2 support people in the waiting area - these visitors may rotate.    Children under the age of 68 must have an adult with them who is not the patient.  Due to an increase in RSV and influenza rates and associated hospitalizations, children ages 27 and under may not visit patients in [place].  If the patient needs to stay at the hospital during part of their recovery, the visitor guidelines for inpatient rooms apply. Pre-op nurse will coordinate an appropriate time for 1 support person to accompany patient in pre-op.  This support person may not rotate.    Please refer to the [facility] website for the visitor guidelines for Inpatients (after your surgery is over and you are in a regular room).    Your procedure is scheduled on: 06/10/22   Report to [facility] Main Entrance    Report to admitting at 7:15 AM   Call this number if you have problems the morning of surgery [phone]   Do not eat food or drink liquids :After Midnight.          If you have questions, please contact your surgeon's office.   FOLLOW BOWEL PREP AND ANY ADDITIONAL PRE OP INSTRUCTIONS YOU RECEIVED FROM YOUR SURGEON'S OFFICE!!!     Oral Hygiene is also important to reduce your risk of infection.                                    Remember - BRUSH YOUR TEETH THE MORNING OF SURGERY WITH YOUR REGULAR TOOTHPASTE  DENTURES WILL BE REMOVED PRIOR TO SURGERY PLEASE DO NOT APPLY "Poly grip" OR ADHESIVES!!!   Take these medicines the morning of surgery with A SIP OF WATER: Allopurinol, Amlodipine, Atorvastatin, Tamsulosin  DO NOT TAKE ANY ORAL DIABETIC MEDICATIONS DAY OF YOUR SURGERY  How to Manage Your Diabetes Before and After Surgery  Why is it important to control my blood sugar before and after surgery? Improving blood sugar levels before and after surgery helps healing and can limit  problems. A way of improving blood sugar control is eating a healthy diet by:  Eating less sugar and carbohydrates  Increasing activity/exercise  Talking with your doctor about reaching your blood sugar goals High blood sugars (greater than 180 mg/dL) can raise your risk of infections and slow your recovery, so you will need to focus on controlling your diabetes during the weeks before surgery. Make sure that the doctor who takes care of your diabetes knows about your planned surgery including the date and location.  How do I manage my blood sugar before surgery? Check your blood sugar at least 4 times a day, starting 2 days before surgery, to make sure that the level is not too high or low. Check your blood sugar the morning of your surgery when you wake up and every 2 hours until you get to the Short Stay unit. If your blood sugar is less than 70 mg/dL, you will need to treat for low blood sugar: Do not take insulin. Treat a low blood sugar (less than 70 mg/dL) with  cup of clear juice (cranberry or apple), 4 glucose tablets, OR glucose gel. Recheck blood sugar in 15 minutes after treatment (to make sure it is greater than 70 mg/dL). If your blood sugar is not greater than  70 mg/dL on recheck, call [phone] for further instructions. Report your blood sugar to the short stay nurse when you get to Short Stay.  If you are admitted to the hospital after surgery: Your blood sugar will be checked by the staff and you will probably be given insulin after surgery (instead of oral diabetes medicines) to make sure you have good blood sugar levels. The goal for blood sugar control after surgery is 80-180 mg/dL.   WHAT DO I DO ABOUT MY DIABETES MEDICATION?  Do not take oral diabetes medicines (pills) the morning of surgery.  THE DAY BEFORE SURGERY, take Metformin as prescribed.     THE MORNING OF SURGERY, do not take Metformin.  Reviewed and Endorsed by [facility] Patient Education  Committee, August 2015                              You may not have any metal on your body including jewelry, and body piercing             Do not wear lotions, powders, cologne, or deodorant  Do not shave  48 hours prior to surgery.               Men may shave face and neck.   Do not bring valuables to the hospital. [place].   Contacts, glasses, dentures or bridgework may not be worn into surgery.   Bring small overnight bag day of surgery.   DO NOT [place]. PHARMACY WILL DISPENSE MEDICATIONS LISTED ON YOUR MEDICATION LIST TO YOU DURING YOUR ADMISSION [place]!   Special Instructions: Bring a copy of your healthcare power of attorney and living will documents the day of surgery if you haven't scanned them before.              Please read over the following fact sheets you were given: IF [place] [phone] Schneiders    If you received a COVID test during your pre-op visit  it is requested that you wear a mask when out in public, stay away from anyone that may not be feeling well and notify your surgeon if you develop symptoms. If you test positive for Covid or have been in contact with anyone that has tested positive in the last 10 days please notify you surgeon.    [place] - Preparing for Surgery Before surgery, you can play an important role.  Because skin is not sterile, your skin needs to be as free of germs as possible.  You can reduce the number of germs on your skin by washing with CHG (chlorahexidine gluconate) soap before surgery.  CHG is an antiseptic cleaner which kills germs and bonds with the skin to continue killing germs even after washing. Please DO NOT use if you have an allergy to CHG or antibacterial soaps.  If your skin becomes reddened/irritated stop using the CHG and inform your nurse when you arrive at Short  Stay. Do not shave (including legs and underarms) for at least 48 hours prior to the first CHG shower.  You may shave your face/neck.  Please follow these instructions carefully:  1.  Shower with CHG Soap the night before surgery and the  morning of surgery.  2.  If you  choose to wash your hair, wash your hair first as usual with your normal  shampoo.  3.  After you shampoo, rinse your hair and body thoroughly to remove the shampoo.                             4.  Use CHG as you would any other liquid soap.  You can apply chg directly to the skin and wash.  Gently with a scrungie or clean washcloth.  5.  Apply the CHG Soap to your body ONLY FROM THE NECK DOWN.   Do   not use on face/ open                           Wound or open sores. Avoid contact with eyes, ears mouth and   genitals (private parts).                       Wash face,  Genitals (private parts) with your normal soap.             6.  Wash thoroughly, paying special attention to the area where your    surgery  will be performed.  7.  Thoroughly rinse your body with warm water from the neck down.  8.  DO NOT shower/wash with your normal soap after using and rinsing off the CHG Soap.                9.  Pat yourself dry with a clean towel.            10.  Wear clean pajamas.            11.  Place clean sheets on your bed the night of your first shower and do not  sleep with pets. Day of Surgery : Do not apply any lotions/deodorants the morning of surgery.  Please wear clean clothes to the hospital/surgery center.  FAILURE TO FOLLOW THESE INSTRUCTIONS MAY RESULT IN THE CANCELLATION OF YOUR SURGERY  PATIENT SIGNATURE_________________________________  NURSE SIGNATURE__________________________________  ________________________________________________________________________

## 2022-06-04 NOTE — Progress Notes (Signed)
COVID Vaccine Completed:  Date of COVID positive in last 90 days:  PCP - Clearnce Harris,MD Cardiologist -   Chest x-ray -  EKG -  Stress Test -  ECHO -  Cardiac Cath -  Pacemaker/ICD device last checked: Spinal Cord Stimulator:  Bowel Prep -   Sleep Study -  CPAP -   Fasting Blood Sugar -  Checks Blood Sugar _____ times a day  Last dose of GLP1 agonist-  N/A GLP1 instructions:  N/A   Last dose of SGLT-2 inhibitors-  N/A SGLT-2 instructions: N/A   Blood Thinner Instructions: Aspirin Instructions: Last Dose:  Activity level:  Can go up a flight of stairs and perform activities of daily living without stopping and without symptoms of chest pain or shortness of breath.  Able to exercise without symptoms  Unable to go up a flight of stairs without symptoms of     Anesthesia review:   Patient denies shortness of breath, fever, cough and chest pain at PAT appointment  Patient verbalized understanding of instructions that were given to them at the PAT appointment. Patient was also instructed that they will need to review over the PAT instructions again at home before surgery. 

## 2022-06-05 ENCOUNTER — Encounter (HOSPITAL_COMMUNITY): Payer: Self-pay

## 2022-06-05 ENCOUNTER — Encounter (HOSPITAL_COMMUNITY)
Admission: RE | Admit: 2022-06-05 | Discharge: 2022-06-05 | Disposition: A | Discharge status: Home / Self Care | Payer: Medicare Other | Source: Ambulatory Visit | Attending: Urology | Admitting: Urology

## 2022-06-05 ENCOUNTER — Other Ambulatory Visit: Payer: Self-pay

## 2022-06-05 VITALS — BP 161/97 | HR 101 | Temp 98.4°F | Resp 14 | Ht 66.5 in | Wt 202.0 lb

## 2022-06-05 DIAGNOSIS — J984 Other disorders of lung: Secondary | ICD-10-CM | POA: Diagnosis not present

## 2022-06-05 DIAGNOSIS — R9431 Abnormal electrocardiogram [ECG] [EKG]: Secondary | ICD-10-CM | POA: Diagnosis not present

## 2022-06-05 DIAGNOSIS — E119 Type 2 diabetes mellitus without complications: Secondary | ICD-10-CM | POA: Diagnosis not present

## 2022-06-05 DIAGNOSIS — Z01818 Encounter for other preprocedural examination: Secondary | ICD-10-CM | POA: Diagnosis not present

## 2022-06-05 HISTORY — DX: Gout, unspecified: M10.9

## 2022-06-05 HISTORY — DX: Type 2 diabetes mellitus without complications: E11.9

## 2022-06-05 HISTORY — DX: Essential (primary) hypertension: I10

## 2022-06-05 LAB — BASIC METABOLIC PANEL
Anion gap: 10 (ref 5–15)
BUN: 23 mg/dL (ref 8–23)
CO2: 24 mmol/L (ref 22–32)
Calcium: 9.2 mg/dL (ref 8.9–10.3)
Chloride: 105 mmol/L (ref 98–111)
Creatinine, Ser: 1.17 mg/dL (ref 0.61–1.24)
GFR, Estimated: >60 mL/min (ref >60)
Glucose, Bld: 178 mg/dL — ABNORMAL HIGH (ref 70–99)
Potassium: 4 mmol/L (ref 3.5–5.1)
Sodium: 139 mmol/L (ref 135–145)

## 2022-06-05 LAB — CBC
HCT: 40.5 % (ref 39.0–52.0)
Hemoglobin: 13.5 g/dL (ref 13.0–17.0)
MCH: 31.7 pg (ref 26.0–34.0)
MCHC: 33.3 g/dL (ref 30.0–36.0)
MCV: 95.1 fL (ref 80.0–100.0)
Platelets: 191 10*3/uL (ref 150–400)
RBC: 4.26 MIL/uL (ref 4.22–5.81)
RDW: 13.4 % (ref 11.5–15.5)
WBC: 7.1 10*3/uL (ref 4.0–10.5)
nRBC: 0 % (ref 0.0–0.2)

## 2022-06-05 LAB — GLUCOSE, CAPILLARY: Glucose-Capillary: 168 mg/dL — ABNORMAL HIGH (ref 70–99)

## 2022-06-05 NOTE — Progress Notes (Signed)
   06/05/22 1404  OBSTRUCTIVE SLEEP APNEA  Have you ever been diagnosed with sleep apnea through a sleep study? No  Do you snore loudly (loud enough to be heard through closed doors)?  1  Do you often feel tired, fatigued, or sleepy during the daytime (such as falling asleep during driving or talking to someone)? 0  Has anyone observed you stop breathing during your sleep? 1  Do you have, or are you being treated for high blood pressure? 1  BMI more than 35 kg/m2? 0  Age > 50 (1-yes) 1  Neck circumference greater than:Male 16 inches or larger, Male 17inches or larger? 0  Male Gender (Yes=1) 1  Obstructive Sleep Apnea Score 5

## 2022-06-06 LAB — HEMOGLOBIN A1C
Hgb A1c MFr Bld: 7.4 % — ABNORMAL HIGH (ref 4.8–5.6)
Mean Plasma Glucose: 166 mg/dL

## 2022-06-09 NOTE — H&P (Signed)
AUA Questions Scoring.  HPI: Jeffrey [name] is a 67 year-old male established patient who is here for evaluation.      CC/HPI: 02/04/21: Jeffrey [name]. He had right ureteroscopy for ureteral stones in his solitary system in 9/22 and is doing well without pain. He brought the stones for analysis. Renal US today demonstrated no hydro or stones. There is a small RLP cyst. His UA has 10-20 RBC's. He has moderate LUTS with urgency and an IPSS of 16. He remains on tamsulosin and finasteride for BPH with BOO and a history of an elevated PSA.   08/05/21: Jeffrey [name] for his history of stones and a solitary right kidney along with BPH with BOO and an elevated PSA. He remains on tamsulosin and finasteride and his PSA remains low at 0.93 which is in his usual range. He has been diagnosed with diabetes since his last visit and he is currently on insulin and metformin. He had fatigue which has improved with management of the diabetes. He has changed his diet and has lost about 25lb. He is voiding well with and IPSS of 13. He has a reduced stream. He was unable to get a UA today. He had no flank pain or hematuria. He has chronic CKD3b that was stable in the fall. I will request recent labs from Dr. Philip [name].   02/10/22: Jeffrey [name] for the history above. He remains on tamsulosin and finasteride. His IPSS is up to 20 and he reports increased urgency with UUI on occasion. He has a stable weak stream and nocturia x 1. He has lost additional weight which has allowed him to come off of insulin. He has a solitary right kidney but has had no flank pain or hematuria.   05/14/22: Jeffrey [name] for his history above. He is on Silodosin 8mg  and finasteride 5mg . HIs IPSS is 24 which is up some. He has a very slow stream and urgency with no UUI. He has nocturia x 3. His PVR is 56ml. He has had no stone symptoms. On CT in 2021 his prostate volume measures about 72ml.     AUA  Symptom Score: 50% of the time he has the sensation of not emptying his bladder completely when finished urinating. 50% of the time he has to urinate again fewer than two hours after he has finished urinating. 50% of the time he has to start and stop again several times when he urinates. More than 50% of the time he finds it difficult to postpone urination. Almost always he has a weak urinary stream. 50% of the time he has to push or strain to begin urination. He has to get up to urinate 3 times from the time he goes to bed until the time he gets up in the morning.   Calculated AUA Symptom Score: 24    ALLERGIES: Percocet    MEDICATIONS: Allopurinol  Finasteride 5 mg tablet 1 tablet PO Daily  Finasteride  Plavix 75 mg tablet  Tamsulosin Hcl 0.4 mg capsule  Amoxicillin  Atorvastatin Calcium 40 mg tablet Oral  Clopidogrel 75 mg tablet  Furosemide  Levothyroxine Sodium 25 mcg capsule  Nitroglycerin  Ranolazine Er 500 mg tablet, extended release 12 hr  Silodosin 8 mg capsule 1 capsule PO Daily  Tradjenta  Vitamin B12  Vitamin D3     GU PSH: Complex Uroflow - 2019 Cystoscopy - 2021 Cystoscopy Retrogrades - 12/03/2020 Locm 300-399Mg /Ml Iodine,1Ml - 2021  Ureteroscopic laser litho, Right - 12/03/2020, 12/03/2020       PSH Notes: Arthroscopy Knee Left, Coronary Artery Bypass Graft (CABG), Knee Replacement, Cath Stent Placement, Knee Surgery, Colon Surgery   His going to have a right cataract extraction on 04/23/17   stent placement 2021   NON-GU PSH: CABG (coronary artery bypass grafting) - 2010 Cardiac Stent Placement - 2021 Knee Arthroscopy; Dx - 2014 Revise Knee Joint - 2009     GU PMH: BPH w/LUTS, He has progressive urgency with occasional UUI. I discussed options and will switch the tamsulosin to silodosin and continue finasteride. he will return in 64months. - 02/10/2022, He is doing well on current therapy and will [name] in 6 months. , - 08/05/2021, He has moderate LUTS but is  content on current therapy. Meds refilled. He will return in 6 months with a PSA. , - 02/04/2021, He has stable LUTS on finasteride and tamsulosin. His PSA remains well suppressed on finasteride. , - 05/30/2020, He is doing well on finasteride and tamsulosin and will continue those. He will return in a year with a PSA which otherwise remains well suppressed with finasteride. . , - 2021, He has had some worsening of his symptoms but wants to stay on current therapy. I did discuss TURP. , - 2020 (Improving), He has had symptomatic improvement with a good urine flow and reduced PVR. He will stay on current therapy and return in 6 months with a PSA for an exam. , - 2019 (Worsening), He has had further prostate enlargement despite the finasteride and tamsulsoin and has continued severe LUT's but he is emptying well. I discussed further therapy with TURP or simple prostatectomy but he is not interested at this time and will return in 6 months for a flowrate and PVR. , - 2019 (Stable), He has stable voiding symptoms on finasteride. , - 2018, Benign prostatic hyperplasia with urinary obstruction, - 2017 Incomplete bladder emptying, PVR is 71ml. - 02/10/2022, PVR is 171ml, - 2018 Renal calculus - 02/10/2022, I will get a KUB at [name] but he has had no worrisome symptoms. , - 05/30/2020, He has stable stones on KUB today. His urine is clear. I will have get another KUB in a year, but he will return in 5mo with a PSA for his routine BPH and elevated PSA [name]. , - 11/30/2019, He has a 8mm cluster of stones in the RLP. I will have him return in 6 months with a KUB to assess stability. , - 2021 Urge incontinence - 02/10/2022 Urinary Urgency (Stable) - 02/10/2022, - 02/04/2021 History of urolithiasis, He has had no symptoms of recurrent stones. He has had no hematuria. - 08/05/2021, He is doing well since his surgery without residual or recurrent stones. He does have some persistent hematuria. He will return in 6 months. , -  02/04/2021 Weak Urinary Stream - 08/05/2021, - 05/30/2020, Weak urinary stream, - 2017 Microscopic hematuria (Stable), He has some residual hematuria that I will monitor. - 02/04/2021, The hematuria is likely from the bladder stones and BPH. No new treatment at this time. , - 2021, His UA has 3-10 RBC's today which is a new finding. The UA wasn't back when he left. I will call him to discuss this finding and arrange [name]. , - 2021 Ureteral calculus - 12/20/2020 Ureteral obstruction secondary to calculous - 12/20/2020 Elevated PSA - 05/30/2020, Elevated prostate specific antigen (PSA), - 2016 Bladder Stone, He has several small bladder stones. I have discussed stone prevention with  hydration and salt restriction and have given him information on dietary changes. July 08, 2019 Renal cyst, He has a 1.7cm RLP simple cyst. Jul 08, 2019 ED due to arterial insufficiency, Erectile dysfunction due to arterial insufficiency - 2014/07/08 Testicular atrophy, Testicular atrophy - 07-07-12 Nocturia, Nocturia - 07/07/2012      PMH Notes:  2007-09-06 08:50:08 - Note: Acute Myocardial Infarction  2007-09-06 08:50:08 - Note: Arthritis  2007-09-06 08:50:08 - Note: Colon Cancer   NON-GU PMH: Renal agenesis, unilateral, He has CKD3b. I will get his most recent labs from Dr. Philip [name]. - 08/05/2021 Tinea cruris, I don't see much rash but will try him on nystatin powder. If that doesn't work, I suggested [employer] or a visit with dermatology. 07-08-19 Encounter for general adult medical examination without abnormal findings, Encounter for preventive health examination - 2015/07/08 Gout, Gout - 07/07/2012 Personal history of other endocrine, nutritional and metabolic disease, History of hypercholesterolemia - Jul 07, 2012    FAMILY HISTORY: Death In The Family Father - Father Death In The Family Mother - Mother Family Health Status Number - Runs In Family   SOCIAL HISTORY: Marital Status: Married Preferred Language: English; Ethnicity: Not Hispanic Or Latino; Race:  White Current Smoking Status: Patient has never smoked.   Tobacco Use Assessment Completed: Used Tobacco in last 30 days? Has never drank.  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     Notes: Never smoker, Occupation:, Alcohol Use, Marital History - Currently Married, Tobacco Use, Caffeine Use   REVIEW OF SYSTEMS:    GU Review Male:   Patient reports frequent urination, get up at night to urinate, leakage of urine, stream starts and stops, trouble starting your stream, and have to strain to urinate . Patient denies hard to postpone urination, burning/ pain with urination, erection problems, and penile pain.  Gastrointestinal (Upper):   Patient denies nausea, vomiting, and indigestion/ heartburn.  Gastrointestinal (Lower):   Patient denies diarrhea and constipation.  Constitutional:   Patient denies fever, night sweats, weight loss, and fatigue.  Skin:   Patient denies skin rash/ lesion and itching.  Eyes:   Patient denies blurred vision and double vision.  Ears/ Nose/ Throat:   Patient denies sore throat and sinus problems.  Hematologic/Lymphatic:   Patient denies swollen glands and easy bruising.  Cardiovascular:   Patient denies leg swelling and chest pains.  Respiratory:   Patient denies shortness of breath and cough.  Endocrine:   Patient denies excessive thirst.  Musculoskeletal:   Patient denies back pain and joint pain.  Neurological:   Patient denies headaches and dizziness.  Psychologic:   Patient denies depression and anxiety.   Notes: Urgency, weak stream    VITAL SIGNS: None   MULTI-SYSTEM PHYSICAL EXAMINATION:    Constitutional: Well-nourished. No physical deformities. Normally developed. Good grooming.  Respiratory: Normal breath sounds. No labored breathing, no use of accessory muscles.   Cardiovascular: Regular rate and rhythm. No murmur, no gallop.   Gastrointestinal: Obese abdomen. No hernia. No mass, no tenderness, no rigidity.      Complexity of  Data:  Records Review:   AUA Symptom Score, Previous Patient Records  Urine Test Review:   Urinalysis  X-Ray Review: C.T. Hematuria: Reviewed Films.     07/29/21 12/21/20 05/23/20 11/28/19 04/27/19 04/23/18 04/15/17 04/08/16  PSA  Total PSA 0.93 ng/mL 1.336 ng/ml 0.87 ng/mL 1.198 ng/ml 1.09 ng/mL 0.86 ng/mL 1.08 ng/mL 1.41 ng/dl    PROCEDURES:         PVR Ultrasound - [id number]  Scanned Volume: 26 cc         Visit Complexity - G2211 Chronic management of BPH with BOO and stones.          Urinalysis Dipstick Dipstick Cont'd  Color: Yellow Bilirubin: Neg mg/dL  Appearance: Clear Ketones: Neg mg/dL  Specific Gravity: 1.020 Blood: Neg ery/uL  pH: 5.5 Protein: Neg mg/dL  Glucose: Neg mg/dL Urobilinogen: 0.2 mg/dL    Nitrites: Neg    Leukocyte Esterase: Neg leu/uL    ASSESSMENT:      ICD-10 Details  1 GU:   BPH w/LUTS - N40.1 Chronic, Worsening - He has progressive LUTS with a 5ml prostate on prior CT. I discussed PAE, TURP, Aquablation and Rezum and mentioned HOLEP and simple prostatectomy. He has CKD3b so PAE is probably not his best bet.   I think he would be a good candidate for Aquablation with his prostate size. I reviewed the risks of bleeding, infection, injury to the urinary tract, incontinence, stricture, sexual dysfunction, thrombotic events and anesthetic complications. He will need cardiac clearance to come off of Plavix.   2   Weak Urinary Stream - R39.12 Chronic, Worsening  3   Nocturia - R35.1 Chronic, Stable  4   Renal calculus - N20.0 Chronic, Stable - He has a known renal stone but no symptoms with a solitary kidney and CKD3b.  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

## 2022-06-10 ENCOUNTER — Encounter (HOSPITAL_COMMUNITY)
Admission: RE | Disposition: A | Discharge status: Home / Self Care | Payer: Self-pay | Source: Home / Self Care | Attending: Urology

## 2022-06-10 ENCOUNTER — Observation Stay (HOSPITAL_COMMUNITY)
Admission: RE | Admit: 2022-06-10 | Discharge: 2022-06-11 | Disposition: A | Discharge status: Home / Self Care | Payer: Medicare Other | Attending: Urology | Admitting: Urology

## 2022-06-10 ENCOUNTER — Ambulatory Visit (HOSPITAL_BASED_OUTPATIENT_CLINIC_OR_DEPARTMENT_OTHER): Payer: Medicare Other | Admitting: Anesthesiology

## 2022-06-10 ENCOUNTER — Encounter (HOSPITAL_COMMUNITY): Payer: Self-pay | Admitting: Urology

## 2022-06-10 ENCOUNTER — Ambulatory Visit (HOSPITAL_COMMUNITY): Payer: Medicare Other | Admitting: Physician Assistant

## 2022-06-10 ENCOUNTER — Other Ambulatory Visit: Payer: Self-pay

## 2022-06-10 DIAGNOSIS — N401 Enlarged prostate with lower urinary tract symptoms: Secondary | ICD-10-CM | POA: Diagnosis not present

## 2022-06-10 DIAGNOSIS — N320 Bladder-neck obstruction: Secondary | ICD-10-CM

## 2022-06-10 DIAGNOSIS — Z96659 Presence of unspecified artificial knee joint: Secondary | ICD-10-CM | POA: Insufficient documentation

## 2022-06-10 DIAGNOSIS — Z79899 Other long term (current) drug therapy: Secondary | ICD-10-CM | POA: Insufficient documentation

## 2022-06-10 DIAGNOSIS — I129 Hypertensive chronic kidney disease with stage 1 through stage 4 chronic kidney disease, or unspecified chronic kidney disease: Secondary | ICD-10-CM | POA: Insufficient documentation

## 2022-06-10 DIAGNOSIS — E1122 Type 2 diabetes mellitus with diabetic chronic kidney disease: Secondary | ICD-10-CM | POA: Insufficient documentation

## 2022-06-10 DIAGNOSIS — R351 Nocturia: Secondary | ICD-10-CM | POA: Diagnosis not present

## 2022-06-10 DIAGNOSIS — Z955 Presence of coronary angioplasty implant and graft: Secondary | ICD-10-CM | POA: Diagnosis not present

## 2022-06-10 DIAGNOSIS — R3912 Poor urinary stream: Secondary | ICD-10-CM | POA: Insufficient documentation

## 2022-06-10 DIAGNOSIS — N1832 Chronic kidney disease, stage 3b: Secondary | ICD-10-CM | POA: Insufficient documentation

## 2022-06-10 DIAGNOSIS — N138 Other obstructive and reflux uropathy: Secondary | ICD-10-CM | POA: Diagnosis not present

## 2022-06-10 DIAGNOSIS — Q600 Renal agenesis, unilateral: Secondary | ICD-10-CM | POA: Diagnosis not present

## 2022-06-10 DIAGNOSIS — E119 Type 2 diabetes mellitus without complications: Secondary | ICD-10-CM

## 2022-06-10 DIAGNOSIS — Z7902 Long term (current) use of antithrombotics/antiplatelets: Secondary | ICD-10-CM | POA: Diagnosis not present

## 2022-06-10 DIAGNOSIS — Z951 Presence of aortocoronary bypass graft: Secondary | ICD-10-CM | POA: Insufficient documentation

## 2022-06-10 LAB — GLUCOSE, CAPILLARY
Glucose-Capillary: 156 mg/dL — ABNORMAL HIGH (ref 70–99)
Glucose-Capillary: 144 mg/dL — ABNORMAL HIGH (ref 70–99)
Glucose-Capillary: 155 mg/dL — ABNORMAL HIGH (ref 70–99)
Glucose-Capillary: 163 mg/dL — ABNORMAL HIGH (ref 70–99)
Glucose-Capillary: 120 mg/dL — ABNORMAL HIGH (ref 70–99)

## 2022-06-10 SURGERY — ABLATION, PROSTATE, TRANSURETHRAL, USING WATERJET
Anesthesia: General

## 2022-06-10 MED ORDER — ROCURONIUM BROMIDE 10 MG/ML (PF) SYRINGE
PREFILLED_SYRINGE | INTRAVENOUS | Status: AC
  Filled 2022-06-10: qty 10

## 2022-06-10 MED ORDER — HYDROMORPHONE HCL 1 MG/ML IJ SOLN: 0.2500 mg | INTRAMUSCULAR | Status: DC | PRN

## 2022-06-10 MED ORDER — OXYCODONE HCL 5 MG/5ML PO SOLN: 5.0000 mg | Freq: Once | ORAL | Status: DC | PRN

## 2022-06-10 MED ORDER — LIDOCAINE HCL (PF) 2 % IJ SOLN
INTRAMUSCULAR | Status: AC
  Filled 2022-06-10: qty 5

## 2022-06-10 MED ORDER — SULFAMETHOXAZOLE-TRIMETHOPRIM 800-160 MG PO TABS: 1.0000 | ORAL_TABLET | Freq: Two times a day (BID) | ORAL | Status: DC

## 2022-06-10 MED ORDER — LIDOCAINE 2% (20 MG/ML) 5 ML SYRINGE
INTRAMUSCULAR | Status: DC | PRN
  Administered 2022-06-10: 60 mg via INTRAVENOUS

## 2022-06-10 MED ORDER — HYOSCYAMINE SULFATE 0.125 MG SL SUBL: 0.1250 mg | SUBLINGUAL_TABLET | SUBLINGUAL | Status: DC | PRN

## 2022-06-10 MED ORDER — ONDANSETRON HCL 4 MG/2ML IJ SOLN: 4.0000 mg | INTRAMUSCULAR | Status: DC | PRN

## 2022-06-10 MED ORDER — MIDAZOLAM HCL 2 MG/2ML IJ SOLN
INTRAMUSCULAR | Status: AC
  Filled 2022-06-10: qty 2

## 2022-06-10 MED ORDER — EPHEDRINE 5 MG/ML INJ
INTRAVENOUS | Status: AC
  Filled 2022-06-10: qty 5

## 2022-06-10 MED ORDER — ONDANSETRON HCL 4 MG/2ML IJ SOLN: 4.0000 mg | Freq: Once | INTRAMUSCULAR | Status: DC | PRN

## 2022-06-10 MED ORDER — EPHEDRINE SULFATE-NACL 50-0.9 MG/10ML-% IV SOSY
PREFILLED_SYRINGE | INTRAVENOUS | Status: DC | PRN
  Administered 2022-06-10: 5 mg via INTRAVENOUS

## 2022-06-10 MED ORDER — ORAL CARE MOUTH RINSE: 15.0000 mL | OROMUCOSAL | Status: DC | PRN

## 2022-06-10 MED ORDER — ONDANSETRON HCL 4 MG/2ML IJ SOLN
INTRAMUSCULAR | Status: DC | PRN
  Administered 2022-06-10: 4 mg via INTRAVENOUS

## 2022-06-10 MED ORDER — ZOLPIDEM TARTRATE 5 MG PO TABS: 5.0000 mg | ORAL_TABLET | Freq: Every evening | ORAL | Status: DC | PRN

## 2022-06-10 MED ORDER — LISINOPRIL 20 MG PO TABS
40.0000 mg | ORAL_TABLET | Freq: Every day | ORAL | Status: DC
  Administered 2022-06-11: 40 mg via ORAL
  Filled 2022-06-10: qty 2

## 2022-06-10 MED ORDER — OXYCODONE HCL 5 MG PO TABS: 5.0000 mg | ORAL_TABLET | ORAL | Status: DC | PRN

## 2022-06-10 MED ORDER — SODIUM CHLORIDE 0.9 % IR SOLN
3000.0000 mL | Status: DC
  Administered 2022-06-10 – 2022-06-11 (×14): 3000 mL

## 2022-06-10 MED ORDER — CIPROFLOXACIN HCL 500 MG PO TABS
500.0000 mg | ORAL_TABLET | Freq: Once | ORAL | Status: AC
  Administered 2022-06-10: 500 mg via ORAL
  Filled 2022-06-10: qty 1

## 2022-06-10 MED ORDER — METFORMIN HCL ER 500 MG PO TB24
500.0000 mg | ORAL_TABLET | Freq: Every day | ORAL | Status: DC
  Administered 2022-06-11: 500 mg via ORAL
  Filled 2022-06-10: qty 1

## 2022-06-10 MED ORDER — OXYCODONE HCL 5 MG PO TABS: 5.0000 mg | ORAL_TABLET | Freq: Once | ORAL | Status: DC | PRN

## 2022-06-10 MED ORDER — PROPOFOL 10 MG/ML IV BOLUS
INTRAVENOUS | Status: DC | PRN
  Administered 2022-06-10: 100 mg via INTRAVENOUS

## 2022-06-10 MED ORDER — CIPROFLOXACIN IN D5W 400 MG/200ML IV SOLN
400.0000 mg | INTRAVENOUS | Status: AC
  Administered 2022-06-10: 400 mg via INTRAVENOUS
  Filled 2022-06-10: qty 200

## 2022-06-10 MED ORDER — FENTANYL CITRATE (PF) 100 MCG/2ML IJ SOLN
INTRAMUSCULAR | Status: AC
  Filled 2022-06-10: qty 2

## 2022-06-10 MED ORDER — ROCURONIUM BROMIDE 10 MG/ML (PF) SYRINGE
PREFILLED_SYRINGE | INTRAVENOUS | Status: DC | PRN
  Administered 2022-06-10: 70 mg via INTRAVENOUS

## 2022-06-10 MED ORDER — FENTANYL CITRATE (PF) 100 MCG/2ML IJ SOLN
INTRAMUSCULAR | Status: DC | PRN
  Administered 2022-06-10 (×2): 50 ug via INTRAVENOUS

## 2022-06-10 MED ORDER — DIPHENHYDRAMINE HCL 50 MG/ML IJ SOLN: 12.5000 mg | Freq: Four times a day (QID) | INTRAMUSCULAR | Status: DC | PRN

## 2022-06-10 MED ORDER — ALLOPURINOL 100 MG PO TABS
200.0000 mg | ORAL_TABLET | Freq: Every day | ORAL | Status: DC
  Administered 2022-06-11: 200 mg via ORAL
  Filled 2022-06-10: qty 2

## 2022-06-10 MED ORDER — 0.9 % SODIUM CHLORIDE (POUR BTL) OPTIME
TOPICAL | Status: DC | PRN
  Administered 2022-06-10: 1000 mL

## 2022-06-10 MED ORDER — MIDAZOLAM HCL 5 MG/5ML IJ SOLN
INTRAMUSCULAR | Status: DC | PRN
  Administered 2022-06-10: 2 mg via INTRAVENOUS

## 2022-06-10 MED ORDER — SENNOSIDES-DOCUSATE SODIUM 8.6-50 MG PO TABS: 1.0000 | ORAL_TABLET | Freq: Every evening | ORAL | Status: DC | PRN

## 2022-06-10 MED ORDER — SUGAMMADEX SODIUM 200 MG/2ML IV SOLN
INTRAVENOUS | Status: DC | PRN
  Administered 2022-06-10: 200 mg via INTRAVENOUS

## 2022-06-10 MED ORDER — INSULIN ASPART 100 UNIT/ML IJ SOLN
0.0000 [IU] | Freq: Three times a day (TID) | INTRAMUSCULAR | Status: DC
  Administered 2022-06-10: 3 [IU] via SUBCUTANEOUS
  Administered 2022-06-11: 2 [IU] via SUBCUTANEOUS

## 2022-06-10 MED ORDER — ONDANSETRON HCL 4 MG/2ML IJ SOLN
INTRAMUSCULAR | Status: AC
  Filled 2022-06-10: qty 2

## 2022-06-10 MED ORDER — AMLODIPINE BESYLATE 5 MG PO TABS
5.0000 mg | ORAL_TABLET | Freq: Every day | ORAL | Status: DC
  Administered 2022-06-11: 5 mg via ORAL
  Filled 2022-06-10: qty 1

## 2022-06-10 MED ORDER — DIPHENHYDRAMINE HCL 12.5 MG/5ML PO ELIX: 12.5000 mg | ORAL_SOLUTION | Freq: Four times a day (QID) | ORAL | Status: DC | PRN

## 2022-06-10 MED ORDER — ORAL CARE MOUTH RINSE: 15.0000 mL | Freq: Once | OROMUCOSAL | Status: AC

## 2022-06-10 MED ORDER — SODIUM CHLORIDE 0.9 % IR SOLN
Status: DC | PRN
  Administered 2022-06-10: 6000 mL

## 2022-06-10 MED ORDER — POTASSIUM CHLORIDE IN NACL 20-0.45 MEQ/L-% IV SOLN
INTRAVENOUS | Status: DC
  Filled 2022-06-10 (×5): qty 1000

## 2022-06-10 MED ORDER — HYDROMORPHONE HCL 1 MG/ML IJ SOLN: 0.5000 mg | INTRAMUSCULAR | Status: DC | PRN

## 2022-06-10 MED ORDER — STERILE WATER FOR IRRIGATION IR SOLN
Status: DC | PRN
  Administered 2022-06-10: 500 mL

## 2022-06-10 MED ORDER — ATORVASTATIN CALCIUM 40 MG PO TABS
40.0000 mg | ORAL_TABLET | Freq: Every day | ORAL | Status: DC
  Administered 2022-06-11: 40 mg via ORAL
  Filled 2022-06-10: qty 1

## 2022-06-10 MED ORDER — FLEET ENEMA 7-19 GM/118ML RE ENEM: 1.0000 | ENEMA | Freq: Once | RECTAL | Status: DC | PRN

## 2022-06-10 MED ORDER — CIPROFLOXACIN HCL 500 MG PO TABS: 500.0000 mg | ORAL_TABLET | Freq: Every day | ORAL | 0 refills | Status: AC

## 2022-06-10 MED ORDER — PHENYLEPHRINE 80 MCG/ML (10ML) SYRINGE FOR IV PUSH (FOR BLOOD PRESSURE SUPPORT)
PREFILLED_SYRINGE | INTRAVENOUS | Status: AC
  Filled 2022-06-10: qty 10

## 2022-06-10 MED ORDER — LACTATED RINGERS IV SOLN: INTRAVENOUS | Status: DC

## 2022-06-10 MED ORDER — ACETAMINOPHEN 325 MG PO TABS
650.0000 mg | ORAL_TABLET | ORAL | Status: DC | PRN
  Administered 2022-06-10: 650 mg via ORAL
  Filled 2022-06-10: qty 2

## 2022-06-10 MED ORDER — CHLORHEXIDINE GLUCONATE 0.12 % MT SOLN
15.0000 mL | Freq: Once | OROMUCOSAL | Status: AC
  Administered 2022-06-10: 15 mL via OROMUCOSAL

## 2022-06-10 MED ORDER — AMISULPRIDE (ANTIEMETIC) 5 MG/2ML IV SOLN: 10.0000 mg | Freq: Once | INTRAVENOUS | Status: DC | PRN

## 2022-06-10 MED ORDER — PHENYLEPHRINE 80 MCG/ML (10ML) SYRINGE FOR IV PUSH (FOR BLOOD PRESSURE SUPPORT)
PREFILLED_SYRINGE | INTRAVENOUS | Status: DC | PRN
  Administered 2022-06-10: 160 ug via INTRAVENOUS
  Administered 2022-06-10: 80 ug via INTRAVENOUS
  Administered 2022-06-10: 160 ug via INTRAVENOUS

## 2022-06-10 MED ORDER — PROPOFOL 10 MG/ML IV BOLUS
INTRAVENOUS | Status: AC
  Filled 2022-06-10: qty 20

## 2022-06-10 MED ORDER — BISACODYL 10 MG RE SUPP: 10.0000 mg | Freq: Every day | RECTAL | Status: DC | PRN

## 2022-06-10 SURGICAL SUPPLY — 27 items
BAG DRN RND TRDRP ANRFLXCHMBR (UROLOGICAL SUPPLIES) ×1
BAG URINE DRAIN 2000ML AR STRL (UROLOGICAL SUPPLIES) ×1 IMPLANT
CANISTER SUCT 3000ML PPV (MISCELLANEOUS) ×1 IMPLANT
CATH HEMA 3WAY 30CC 22FR COUDE (CATHETERS) IMPLANT
CATH HEMA 3WAY 30CC 24FR COUDE (CATHETERS) IMPLANT
COVER MAYO STAND STRL (DRAPES) ×1 IMPLANT
DRAPE FOOT SWITCH (DRAPES) ×1 IMPLANT
GEL ULTRASOUND 8.5O AQUASONIC (MISCELLANEOUS) ×1 IMPLANT
GLOVE SURG LX STRL 7.5 STRW (GLOVE) ×1 IMPLANT
GOWN STRL REUS W/ TWL XL LVL3 (GOWN DISPOSABLE) ×1 IMPLANT
GOWN STRL REUS W/TWL XL LVL3 (GOWN DISPOSABLE) ×1
HANDPIECE AQUABEAM (MISCELLANEOUS) ×1 IMPLANT
HOLDER FOLEY CATH W/STRAP (MISCELLANEOUS) IMPLANT
KIT TURNOVER KIT A (KITS) IMPLANT
LOOP CUT BIPOLAR 24F LRG (ELECTROSURGICAL) IMPLANT
MANIFOLD NEPTUNE II (INSTRUMENTS) ×1 IMPLANT
MAT ABSORB  FLUID 56X50 GRAY (MISCELLANEOUS) ×1
MAT ABSORB FLUID 56X50 GRAY (MISCELLANEOUS) ×1 IMPLANT
PACK CYSTO (CUSTOM PROCEDURE TRAY) ×1 IMPLANT
PACK DRAPE AQUABEAM (MISCELLANEOUS) ×1 IMPLANT
SYR 30ML LL (SYRINGE) ×1 IMPLANT
SYR TOOMEY IRRIG 70ML (MISCELLANEOUS) ×2
SYRINGE TOOMEY IRRIG 70ML (MISCELLANEOUS) ×2 IMPLANT
TOWEL OR 17X24 6PK STRL BLUE (TOWEL DISPOSABLE) ×1 IMPLANT
TUBING CONNECTING 10 (TUBING) ×1 IMPLANT
TUBING UROLOGY SET (TUBING) ×1 IMPLANT
UNDERPAD 30X36 HEAVY ABSORB (UNDERPADS AND DIAPERS) ×1 IMPLANT

## 2022-06-10 NOTE — Progress Notes (Signed)
Patient ID: Jeffrey [name], male   DOB: 08-06-1955, 67 y.o.   MRN: [id number]  Urine clears quickly with CBI with the foley off of traction.    .BP (!) 116/59 (BP Location: Right Arm)   Pulse 67   Temp 98 F (36.7 C) (Oral)   Resp 18   Ht 5\' 6"  (1.676 m)   Wt 89.8 kg   SpO2 98%   BMI 31.96 kg/m   Continues CBI and assess for D/c in am.

## 2022-06-10 NOTE — Anesthesia Postprocedure Evaluation (Signed)
Anesthesia Post Note  Patient: Jeffrey [name]  Procedure(s) Performed: TRANSURETHRAL WATERJET ABLATION OF PROSTATE     Patient location during evaluation: PACU Anesthesia Type: General Level of consciousness: awake and alert and oriented Pain management: pain level controlled Vital Signs Assessment: post-procedure vital signs reviewed and stable Respiratory status: spontaneous breathing, nonlabored ventilation and respiratory function stable Cardiovascular status: blood pressure returned to baseline and stable Postop Assessment: no apparent nausea or vomiting Anesthetic complications: no   No notable events documented.  Last Vitals:  Vitals:   06/10/22 1116 06/10/22 1130  BP: 120/73 115/72  Pulse: 85 74  Resp: 16 14  Temp:    SpO2: 90% 94%    Last Pain:  Vitals:   06/10/22 1130  TempSrc:   PainSc: 0-No pain                 Jake Fuhrmann A.

## 2022-06-10 NOTE — Anesthesia Procedure Notes (Addendum)
Procedure Name: Intubation Date/Time: 06/10/2022 9:40 AM  Performed by: Lind Covert, CRNAPre-anesthesia Checklist: Patient identified, Emergency Drugs available, Suction available, Patient being monitored and Timeout performed Patient Re-evaluated:Patient Re-evaluated prior to induction Oxygen Delivery Method: Circle system utilized Preoxygenation: Pre-oxygenation with 100% oxygen Induction Type: IV induction Ventilation: Mask ventilation without difficulty Laryngoscope Size: Mac and 4 Grade View: Grade I Tube type: Oral Tube size: 7.5 mm Number of attempts: 1 Airway Equipment and Method: Stylet Placement Confirmation: ETT inserted through vocal cords under direct vision, positive ETCO2 and breath sounds checked- equal and bilateral Secured at: 23 cm Tube secured with: Tape Dental Injury: Teeth and Oropharynx as per pre-operative assessment

## 2022-06-10 NOTE — Interval H&P Note (Signed)
History and Physical Interval Note:  06/10/2022 9:10 AM  Jeffrey [name]  has presented today for surgery, with the diagnosis of [place].  The various methods of treatment have been discussed with the patient and family. After consideration of risks, benefits and other options for treatment, the patient has consented to  Procedure(s) with comments: TRANSURETHRAL WATERJET ABLATION OF PROSTATE (N/A) - 90 MINS as a surgical intervention.  The patient's history has been reviewed, patient examined, no change in status, stable for surgery.  I have reviewed the patient's chart and labs.  Questions were answered to the patient's satisfaction.     Irine Seal

## 2022-06-10 NOTE — Transfer of Care (Signed)
Immediate Anesthesia Transfer of Care Note  Patient: Jeffrey [name]  Procedure(s) Performed: TRANSURETHRAL WATERJET ABLATION OF PROSTATE  Patient Location: PACU  Anesthesia Type:General  Level of Consciousness: sedated  Airway & Oxygen Therapy: Patient Spontanous Breathing and Patient connected to face mask oxygen  Post-op Assessment: Report given to RN and Post -op Vital signs reviewed and stable  Post vital signs: Reviewed and stable  Last Vitals:  Vitals Value Taken Time  BP 124/77 06/10/22 1110  Temp    Pulse 84 06/10/22 1112  Resp 15 06/10/22 1112  SpO2 99 % 06/10/22 1112  Vitals shown include unvalidated device data.  Last Pain:  Vitals:   06/10/22 0737  TempSrc:   PainSc: 0-No pain         Complications: No notable events documented.

## 2022-06-10 NOTE — Op Note (Signed)
Procedure: Cystoscopy with Aquablation.  Preop diagnosis: BPH with bladder outlet obstruction.  Postop diagnosis: Same.  Surgeon: Dr. Irine Seal.  Anesthesia: General.  Specimen: Prostate tissue.  Drains: 22 French  three-way Foley catheter.  EBL: 300 mL.  Complications: None.  Indications: The patient is a 67 year old male with BPH and bladder obstruction and progressive symptoms on maximal medical therapy.  He has a 90 mL prostate and is elected aqua ablation for management.  Procedure: He was taken the operating room was given Cipro.  A general anesthetic was induced.  He was placed in lithotomy position and fitted with PAS hose.  A rectal exam was performed demonstrating normal anus and perineum without stenosis and the prostate was moderately enlarged without nodules.  The rectum was then instilled with 60 mL of lubricating jelly.  The ultrasound probe was then assembled and inserted parallel to the floor and then the hand piece was dropped to provide anterior compression.  The probe was then aligned in the sagittal plane.  He was then prepped with Betadine solution and draped in usual sterile fashion.  Cystoscopy was performed using the 21 [place] scope and 30 degree lens.  Examination revealed a normal urethra.  The external sphincter was intact.  The prostatic urethra had primarily bilateral hyperplasia with obstruction with a small middle lobe the prostatic urethral length was approximately 5 cm.  Examination of bladder revealed mild trabeculation without tumors, stones or inflammation.  Ureteral orifices were unremarkable.  The cystoscope was removed and the Aquablation handpiece was then assembled and inserted and once properly positioned in-plane with the ultrasound and sufficiently far into the bladder it was connected to the support arm.  The scope was then backed out to the Vero with the water jet positioned at the proximal prostate.  The aqua ablation console was then used to  position the water jet appropriately in the standard fashion.  The ultrasound was then switched to a transverse in the middle lobe resection was planned followed by the mid prostate plan.  The treatment depth was then planned in the sagittal plane.  Once the treatment plan was complete the treatment was initiated with a resection time approximately 3 minutes 20 seconds with inferior sparing.  Once the first pass of incomplete the device was reset and adjusted as needed and the second pass was completed.  At this point the handset was removed and pressure on the bladder produced good stream bloody urine.  Urethra was then calibrated to 30 [place] with [employer] sounds and the 28 French continuous-flow resectoscope sheath was inserted with the visual obturator.  The rectal ultrasound was then removed to allow free movement of the [place] handle which was placed.  Had been fitted with a bipolar loop and the 30 degree lens.  Saline was used the irrigant.  Some clot and debris was then removed and some of the fluffy tissue was resected from the bladder neck allowing better exposure of the arterial bleeders at the bladder neck which were then fulgurated.  Some tissue was resected at the right bladder neck and at the left anterior prostate proximally to better identify the bleeders.  Once all significant bleeders had been fulgurated the bladder was evacuated free of clot and debris and chips.  Final inspection revealed the ureteral orifice ease to be intact.  No significant bladder wall injury was noted.  There was some venous ooze from the prostatic urethra but no arterial bleeders.  The external sphincter was intact.  The scope  was removed and pressure on the bladder produced good stream.  A 22 French coud tip hematuria catheter was then advanced without difficulty into the bladder and the balloon was filled with 30 mL of sterile fluid.  The bladder was drained and hand irrigated on traction until clear and then  placed to continuous irrigation and straight drainage.  The drapes were removed after retrieving some material was sent to pathology.  The catheter was held on traction throughout this maneuver.  He was taken down from lithotomy position and the catheter was secured on traction to his left thigh with Hypafix tape.  At this point the irrigant was running crystal-clear.  His anesthetic was reversed and he was moved recovery in stable condition.  There were no complications.

## 2022-06-10 NOTE — Anesthesia Preprocedure Evaluation (Addendum)
Anesthesia Evaluation  Patient identified by MRN, date of birth, ID band Patient awake    Reviewed: Allergy & Precautions, NPO status , Patient's Chart, lab work & pertinent test results  Airway Mallampati: III  TM Distance: >3 FB Neck ROM: Full    Dental no notable dental hx. (+) Teeth Intact, Caps, Dental Advisory Given   Pulmonary neg pulmonary ROS   Pulmonary exam normal breath sounds clear to auscultation       Cardiovascular hypertension, Pt. on medications Normal cardiovascular exam Rhythm:Regular Rate:Normal  EKG 06/05/22 NSR, LAD, Pulmonary disease pattern   Neuro/Psych negative neurological ROS  negative psych ROS   GI/Hepatic negative GI ROS, Neg liver ROS,,,  Endo/Other  diabetes, Poorly Controlled, Type 2, Oral Hypoglycemic Agents  Obesity Hyperlipidemia Gout  Renal/GU negative Renal ROS   BPH with bladder outlet obstruction negative genitourinary   Musculoskeletal negative musculoskeletal ROS (+)    Abdominal  (+) + obese  Peds  Hematology   Anesthesia Other Findings   Reproductive/Obstetrics                             Anesthesia Physical Anesthesia Plan  ASA: 2  Anesthesia Plan: General   Post-op Pain Management: Dilaudid IV   Induction: Intravenous  PONV Risk Score and Plan: 3 and Treatment may vary due to age or medical condition and Ondansetron  Airway Management Planned: Oral ETT  Additional Equipment: None  Intra-op Plan:   Post-operative Plan: Extubation in OR  Informed Consent: I have reviewed the patients History and Physical, chart, labs and discussed the procedure including the risks, benefits and alternatives for the proposed anesthesia with the patient or authorized representative who has indicated his/her understanding and acceptance.     Dental advisory given  Plan Discussed with: CRNA and Anesthesiologist  Anesthesia Plan Comments:          Anesthesia Quick Evaluation

## 2022-06-11 DIAGNOSIS — Z79899 Other long term (current) drug therapy: Secondary | ICD-10-CM | POA: Diagnosis not present

## 2022-06-11 DIAGNOSIS — Z955 Presence of coronary angioplasty implant and graft: Secondary | ICD-10-CM | POA: Diagnosis not present

## 2022-06-11 DIAGNOSIS — I129 Hypertensive chronic kidney disease with stage 1 through stage 4 chronic kidney disease, or unspecified chronic kidney disease: Secondary | ICD-10-CM | POA: Diagnosis not present

## 2022-06-11 DIAGNOSIS — Z7902 Long term (current) use of antithrombotics/antiplatelets: Secondary | ICD-10-CM | POA: Diagnosis not present

## 2022-06-11 DIAGNOSIS — Z96659 Presence of unspecified artificial knee joint: Secondary | ICD-10-CM | POA: Diagnosis not present

## 2022-06-11 DIAGNOSIS — Z951 Presence of aortocoronary bypass graft: Secondary | ICD-10-CM | POA: Diagnosis not present

## 2022-06-11 DIAGNOSIS — R3912 Poor urinary stream: Secondary | ICD-10-CM | POA: Diagnosis not present

## 2022-06-11 DIAGNOSIS — E1122 Type 2 diabetes mellitus with diabetic chronic kidney disease: Secondary | ICD-10-CM | POA: Diagnosis not present

## 2022-06-11 DIAGNOSIS — N401 Enlarged prostate with lower urinary tract symptoms: Secondary | ICD-10-CM | POA: Diagnosis not present

## 2022-06-11 DIAGNOSIS — Q600 Renal agenesis, unilateral: Secondary | ICD-10-CM | POA: Diagnosis not present

## 2022-06-11 DIAGNOSIS — N320 Bladder-neck obstruction: Secondary | ICD-10-CM | POA: Diagnosis not present

## 2022-06-11 DIAGNOSIS — N1832 Chronic kidney disease, stage 3b: Secondary | ICD-10-CM | POA: Diagnosis not present

## 2022-06-11 LAB — GLUCOSE, CAPILLARY: Glucose-Capillary: 133 mg/dL — ABNORMAL HIGH (ref 70–99)

## 2022-06-11 LAB — HIV ANTIBODY (ROUTINE TESTING W REFLEX): HIV Screen 4th Generation wRfx: NONREACTIVE

## 2022-06-11 LAB — SURGICAL PATHOLOGY

## 2022-06-11 NOTE — Progress Notes (Signed)
  Transition of Care [facility]) Screening Note   Patient Details  Name: Jeffrey [name] Date of Birth: Feb 08, 1956   Transition of Care [facility]) CM/SW Contact:    Dessa Phi, RN Phone Number: 06/11/2022, 9:43 AM    Transition of Care Department [facility]) has reviewed patient and no TOC needs have been identified at this time. We will continue to monitor patient advancement through interdisciplinary progression rounds. If new patient transition needs arise, please place a TOC consult.

## 2022-06-11 NOTE — Discharge Summary (Signed)
Physician Discharge Summary  Patient ID: Jeffrey [name] MRN: [id number] DOB/AGE: Jul 16, 1955 67 y.o.  Admit date: 06/10/2022 Discharge date: 06/11/2022  Admission Diagnoses:  BPH with obstruction/lower urinary tract symptoms  Discharge Diagnoses:  Principal Problem:   BPH with obstruction/lower urinary tract symptoms   Past Medical History:  Diagnosis Date   Diabetes mellitus without complication ([employer])    Gout    Hypertension     Surgeries: Procedure(s): TRANSURETHRAL WATERJET ABLATION OF PROSTATE on 06/10/2022   Consultants (if any):   Discharged Condition: Improved  Hospital Course: Jeffrey [name] is an 67 y.o. male who was admitted 06/10/2022 with a diagnosis of BPH with obstruction/lower urinary tract symptoms and went to the operating room on 06/10/2022 and underwent the above named procedures.  His urine is dark pink on minimal CBI today and he is felt to be ready for discharge with the foley.   He was given perioperative antibiotics:  Anti-infectives (From admission, onward)    Start     Dose/Rate Route Frequency Ordered Stop   06/10/22 2000  ciprofloxacin (CIPRO) tablet 500 mg        500 mg Oral  Once 06/10/22 1342 06/10/22 1948   06/10/22 1115  sulfamethoxazole-trimethoprim (BACTRIM DS) 800-160 MG per tablet 1 tablet  Status:  Discontinued        1 tablet Oral Every 12 hours 06/10/22 1110 06/10/22 1110   06/10/22 0726  ciprofloxacin (CIPRO) IVPB 400 mg        400 mg 200 mL/hr over 60 Minutes Intravenous 60 min pre-op 06/10/22 0726 06/10/22 0942   06/10/22 0000  ciprofloxacin (CIPRO) 500 MG tablet        500 mg Oral Daily at bedtime 06/10/22 1111       .  He was given sequential compression devices for DVT prophylaxis.  He benefited maximally from the hospital stay and there were no complications.    Recent vital signs:  Vitals:   06/10/22 2349 06/11/22 0418  BP: 107/64 105/73  Pulse: 60 63  Resp: 20 20  Temp: 97.9 F (36.6 C) 98.1 F (36.7 C)   SpO2: 95% 95%    Recent laboratory studies:  Lab Results  Component Value Date   HGB 13.5 06/05/2022   HGB 12.3 (L) 09/07/2010   HGB 12.7 (L) 09/06/2010   Lab Results  Component Value Date   WBC 7.1 06/05/2022   PLT 191 06/05/2022   No results found for: "INR" Lab Results  Component Value Date   NA 139 06/05/2022   K 4.0 06/05/2022   CL 105 06/05/2022   CO2 24 06/05/2022   BUN 23 06/05/2022   CREATININE 1.17 06/05/2022   GLUCOSE 178 (H) 06/05/2022    Discharge Medications:   Allergies as of 06/11/2022       Reactions   Penicillins Rash        Medication List     TAKE these medications    allopurinol 100 MG tablet Commonly known as: ZYLOPRIM Take 200 mg by mouth in the morning.   amLODipine 5 MG tablet Commonly known as: NORVASC Take 5 mg by mouth in the morning.   atorvastatin 40 MG tablet Commonly known as: LIPITOR Take 40 mg by mouth in the morning.   ciprofloxacin 500 MG tablet Commonly known as: Cipro Take 1 tablet (500 mg total) by mouth at bedtime.   lisinopril 40 MG tablet Commonly known as: ZESTRIL Take 40 mg by mouth in the morning.   metFORMIN  500 MG 24 hr tablet Commonly known as: GLUCOPHAGE-XR Take 500 mg by mouth in the morning.        Diagnostic Studies: No results found.  Disposition: Discharge disposition: 01-Home or Self Care       Discharge Instructions     Discontinue IV   Complete by: As directed         Follow-up Information     [facility] Follow up.   Why: I will have the office contact you to return monday to have the catheter removed. Contact information: [address]                 Signed: Irine Seal 06/11/2022, 6:51 AM

## 2022-06-11 NOTE — Plan of Care (Signed)
  Problem: Clinical Measurements: Goal: Diagnostic test results will improve Outcome: Progressing   Problem: Elimination: Goal: Will not experience complications related to urinary retention Outcome: Progressing   

## 2022-06-25 DIAGNOSIS — R3121 Asymptomatic microscopic hematuria: Secondary | ICD-10-CM | POA: Diagnosis not present

## 2022-09-24 DIAGNOSIS — R350 Frequency of micturition: Secondary | ICD-10-CM | POA: Diagnosis not present

## 2022-11-12 DIAGNOSIS — E1165 Type 2 diabetes mellitus with hyperglycemia: Secondary | ICD-10-CM | POA: Diagnosis not present

## 2022-11-12 DIAGNOSIS — Z0000 Encounter for general adult medical examination without abnormal findings: Secondary | ICD-10-CM | POA: Diagnosis not present

## 2022-11-12 DIAGNOSIS — Z8739 Personal history of other diseases of the musculoskeletal system and connective tissue: Secondary | ICD-10-CM | POA: Diagnosis not present

## 2022-11-12 DIAGNOSIS — R0683 Snoring: Secondary | ICD-10-CM | POA: Diagnosis not present

## 2022-11-12 DIAGNOSIS — E7800 Pure hypercholesterolemia, unspecified: Secondary | ICD-10-CM | POA: Diagnosis not present

## 2022-11-12 DIAGNOSIS — I10 Essential (primary) hypertension: Secondary | ICD-10-CM | POA: Diagnosis not present

## 2022-12-09 DIAGNOSIS — R0683 Snoring: Secondary | ICD-10-CM | POA: Diagnosis not present

## 2022-12-18 DIAGNOSIS — I10 Essential (primary) hypertension: Secondary | ICD-10-CM | POA: Diagnosis not present

## 2022-12-18 DIAGNOSIS — E119 Type 2 diabetes mellitus without complications: Secondary | ICD-10-CM | POA: Diagnosis not present

## 2023-01-01 DIAGNOSIS — G4733 Obstructive sleep apnea (adult) (pediatric): Secondary | ICD-10-CM | POA: Diagnosis not present

## 2023-01-18 DIAGNOSIS — E119 Type 2 diabetes mellitus without complications: Secondary | ICD-10-CM | POA: Diagnosis not present

## 2023-01-18 DIAGNOSIS — I10 Essential (primary) hypertension: Secondary | ICD-10-CM | POA: Diagnosis not present

## 2023-02-04 DIAGNOSIS — E119 Type 2 diabetes mellitus without complications: Secondary | ICD-10-CM | POA: Diagnosis not present

## 2023-02-10 DIAGNOSIS — H11823 Conjunctivochalasis, bilateral: Secondary | ICD-10-CM | POA: Diagnosis not present

## 2023-02-18 DIAGNOSIS — E119 Type 2 diabetes mellitus without complications: Secondary | ICD-10-CM | POA: Diagnosis not present

## 2023-02-18 DIAGNOSIS — I10 Essential (primary) hypertension: Secondary | ICD-10-CM | POA: Diagnosis not present

## 2023-03-04 DIAGNOSIS — E119 Type 2 diabetes mellitus without complications: Secondary | ICD-10-CM | POA: Diagnosis not present

## 2023-03-04 DIAGNOSIS — D3131 Benign neoplasm of right choroid: Secondary | ICD-10-CM | POA: Diagnosis not present

## 2023-03-04 DIAGNOSIS — H2513 Age-related nuclear cataract, bilateral: Secondary | ICD-10-CM | POA: Diagnosis not present

## 2023-03-21 DIAGNOSIS — I10 Essential (primary) hypertension: Secondary | ICD-10-CM | POA: Diagnosis not present

## 2023-03-21 DIAGNOSIS — E119 Type 2 diabetes mellitus without complications: Secondary | ICD-10-CM | POA: Diagnosis not present

## 2023-03-24 DIAGNOSIS — G4733 Obstructive sleep apnea (adult) (pediatric): Secondary | ICD-10-CM | POA: Diagnosis not present

## 2023-04-21 DIAGNOSIS — E119 Type 2 diabetes mellitus without complications: Secondary | ICD-10-CM | POA: Diagnosis not present

## 2023-04-21 DIAGNOSIS — I10 Essential (primary) hypertension: Secondary | ICD-10-CM | POA: Diagnosis not present

## 2023-05-19 DIAGNOSIS — I10 Essential (primary) hypertension: Secondary | ICD-10-CM | POA: Diagnosis not present

## 2023-05-19 DIAGNOSIS — E7800 Pure hypercholesterolemia, unspecified: Secondary | ICD-10-CM | POA: Diagnosis not present

## 2023-05-19 DIAGNOSIS — G4733 Obstructive sleep apnea (adult) (pediatric): Secondary | ICD-10-CM | POA: Diagnosis not present

## 2023-05-19 DIAGNOSIS — E1165 Type 2 diabetes mellitus with hyperglycemia: Secondary | ICD-10-CM | POA: Diagnosis not present

## 2023-05-19 DIAGNOSIS — Z8739 Personal history of other diseases of the musculoskeletal system and connective tissue: Secondary | ICD-10-CM | POA: Diagnosis not present

## 2023-05-21 DIAGNOSIS — E119 Type 2 diabetes mellitus without complications: Secondary | ICD-10-CM | POA: Diagnosis not present

## 2023-05-21 DIAGNOSIS — I10 Essential (primary) hypertension: Secondary | ICD-10-CM | POA: Diagnosis not present

## 2023-06-11 DIAGNOSIS — D126 Benign neoplasm of colon, unspecified: Secondary | ICD-10-CM | POA: Diagnosis not present

## 2023-06-11 DIAGNOSIS — Z860100 Personal history of colon polyps, unspecified: Secondary | ICD-10-CM | POA: Diagnosis not present

## 2023-06-11 DIAGNOSIS — D123 Benign neoplasm of transverse colon: Secondary | ICD-10-CM | POA: Diagnosis not present

## 2023-06-11 DIAGNOSIS — K5730 Diverticulosis of large intestine without perforation or abscess without bleeding: Secondary | ICD-10-CM | POA: Diagnosis not present

## 2023-06-11 DIAGNOSIS — Z1211 Encounter for screening for malignant neoplasm of colon: Secondary | ICD-10-CM | POA: Diagnosis not present

## 2023-06-11 DIAGNOSIS — Z860101 Personal history of adenomatous and serrated colon polyps: Secondary | ICD-10-CM | POA: Diagnosis not present

## 2023-06-21 DIAGNOSIS — I10 Essential (primary) hypertension: Secondary | ICD-10-CM | POA: Diagnosis not present

## 2023-06-21 DIAGNOSIS — E119 Type 2 diabetes mellitus without complications: Secondary | ICD-10-CM | POA: Diagnosis not present

## 2023-07-21 DIAGNOSIS — I10 Essential (primary) hypertension: Secondary | ICD-10-CM | POA: Diagnosis not present

## 2023-07-21 DIAGNOSIS — E119 Type 2 diabetes mellitus without complications: Secondary | ICD-10-CM | POA: Diagnosis not present

## 2023-08-21 DIAGNOSIS — I10 Essential (primary) hypertension: Secondary | ICD-10-CM | POA: Diagnosis not present

## 2023-08-21 DIAGNOSIS — E119 Type 2 diabetes mellitus without complications: Secondary | ICD-10-CM | POA: Diagnosis not present

## 2023-09-20 DIAGNOSIS — E119 Type 2 diabetes mellitus without complications: Secondary | ICD-10-CM | POA: Diagnosis not present

## 2023-09-20 DIAGNOSIS — I10 Essential (primary) hypertension: Secondary | ICD-10-CM | POA: Diagnosis not present

## 2023-10-21 DIAGNOSIS — E119 Type 2 diabetes mellitus without complications: Secondary | ICD-10-CM | POA: Diagnosis not present

## 2023-10-21 DIAGNOSIS — I10 Essential (primary) hypertension: Secondary | ICD-10-CM | POA: Diagnosis not present

## 2023-11-20 DIAGNOSIS — E119 Type 2 diabetes mellitus without complications: Secondary | ICD-10-CM | POA: Diagnosis not present

## 2023-11-20 DIAGNOSIS — I10 Essential (primary) hypertension: Secondary | ICD-10-CM | POA: Diagnosis not present

## 2023-12-01 DIAGNOSIS — E7800 Pure hypercholesterolemia, unspecified: Secondary | ICD-10-CM | POA: Diagnosis not present

## 2023-12-01 DIAGNOSIS — Z0000 Encounter for general adult medical examination without abnormal findings: Secondary | ICD-10-CM | POA: Diagnosis not present

## 2023-12-01 DIAGNOSIS — G4733 Obstructive sleep apnea (adult) (pediatric): Secondary | ICD-10-CM | POA: Diagnosis not present

## 2023-12-01 DIAGNOSIS — I10 Essential (primary) hypertension: Secondary | ICD-10-CM | POA: Diagnosis not present

## 2023-12-01 DIAGNOSIS — E1165 Type 2 diabetes mellitus with hyperglycemia: Secondary | ICD-10-CM | POA: Diagnosis not present

## 2023-12-01 DIAGNOSIS — Z8739 Personal history of other diseases of the musculoskeletal system and connective tissue: Secondary | ICD-10-CM | POA: Diagnosis not present

## 2023-12-01 DIAGNOSIS — Z23 Encounter for immunization: Secondary | ICD-10-CM | POA: Diagnosis not present

## 2023-12-20 DIAGNOSIS — E119 Type 2 diabetes mellitus without complications: Secondary | ICD-10-CM | POA: Diagnosis not present

## 2023-12-20 DIAGNOSIS — I10 Essential (primary) hypertension: Secondary | ICD-10-CM | POA: Diagnosis not present

## 2024-04-08 NOTE — Progress Notes (Signed)
 Jeffrey [name]                                          MRN: [id number]   04/08/2024   The [facility] Quality Team Specialist reviewed this patient medical record for the purposes of chart review for care gap closure. The following were reviewed: chart review for care gap closure-glycemic status assessment.    [facility] Quality Team
